# Patient Record
Sex: Female | Born: 1945 | Race: Black or African American | Hispanic: No | Marital: Single | Smoking: Never smoker
Health system: Southern US, Community
[De-identification: ages and names within clinical notes are randomized; demographics above are authoritative.]

## PROBLEM LIST (undated history)

## (undated) DIAGNOSIS — C50919 Malignant neoplasm of unspecified site of unspecified female breast: Secondary | ICD-10-CM

## (undated) DIAGNOSIS — C4490 Unspecified malignant neoplasm of skin, unspecified: Secondary | ICD-10-CM

## (undated) DIAGNOSIS — M1990 Unspecified osteoarthritis, unspecified site: Secondary | ICD-10-CM

## (undated) DIAGNOSIS — E7800 Pure hypercholesterolemia, unspecified: Secondary | ICD-10-CM

## (undated) DIAGNOSIS — F329 Major depressive disorder, single episode, unspecified: Secondary | ICD-10-CM

## (undated) DIAGNOSIS — Z9221 Personal history of antineoplastic chemotherapy: Secondary | ICD-10-CM

## (undated) DIAGNOSIS — I10 Essential (primary) hypertension: Secondary | ICD-10-CM

## (undated) DIAGNOSIS — Q8500 Neurofibromatosis, unspecified: Secondary | ICD-10-CM

## (undated) DIAGNOSIS — Z923 Personal history of irradiation: Secondary | ICD-10-CM

## (undated) DIAGNOSIS — F32A Depression, unspecified: Secondary | ICD-10-CM

## (undated) DIAGNOSIS — M810 Age-related osteoporosis without current pathological fracture: Secondary | ICD-10-CM

## (undated) DIAGNOSIS — D509 Iron deficiency anemia, unspecified: Secondary | ICD-10-CM

## (undated) DIAGNOSIS — M359 Systemic involvement of connective tissue, unspecified: Secondary | ICD-10-CM

## (undated) DIAGNOSIS — E785 Hyperlipidemia, unspecified: Secondary | ICD-10-CM

## (undated) HISTORY — DX: Major depressive disorder, single episode, unspecified: F32.9

## (undated) HISTORY — DX: Depression, unspecified: F32.A

## (undated) HISTORY — PX: ABDOMINAL HYSTERECTOMY: SHX81

## (undated) HISTORY — DX: Iron deficiency anemia, unspecified: D50.9

## (undated) HISTORY — DX: Pure hypercholesterolemia, unspecified: E78.00

## (undated) HISTORY — DX: Hyperlipidemia, unspecified: E78.5

## (undated) HISTORY — DX: Age-related osteoporosis without current pathological fracture: M81.0

## (undated) HISTORY — DX: Neurofibromatosis, unspecified: Q85.00

## (undated) HISTORY — PX: TONSILLECTOMY: SUR1361

## (undated) HISTORY — DX: Unspecified osteoarthritis, unspecified site: M19.90

## (undated) HISTORY — PX: OTHER SURGICAL HISTORY: SHX169

---

## 2004-08-08 ENCOUNTER — Ambulatory Visit: Payer: Self-pay | Admitting: Unknown Physician Specialty

## 2013-03-11 ENCOUNTER — Emergency Department: Payer: Self-pay | Admitting: Emergency Medicine

## 2013-11-04 HISTORY — PX: MASTECTOMY: SHX3

## 2014-02-10 ENCOUNTER — Ambulatory Visit: Payer: Self-pay | Admitting: Surgery

## 2014-02-18 ENCOUNTER — Ambulatory Visit: Payer: Self-pay | Admitting: Surgery

## 2014-03-15 ENCOUNTER — Ambulatory Visit: Payer: Self-pay | Admitting: Hematology and Oncology

## 2014-03-15 LAB — COMPREHENSIVE METABOLIC PANEL
ALBUMIN: 4.3 g/dL (ref 3.4–5.0)
AST: 16 U/L (ref 15–37)
Alkaline Phosphatase: 40 U/L — ABNORMAL LOW
Anion Gap: 9 (ref 7–16)
BILIRUBIN TOTAL: 0.5 mg/dL (ref 0.2–1.0)
BUN: 14 mg/dL (ref 7–18)
CO2: 28 mmol/L (ref 21–32)
CREATININE: 0.76 mg/dL (ref 0.60–1.30)
Calcium, Total: 9.3 mg/dL (ref 8.5–10.1)
Chloride: 103 mmol/L (ref 98–107)
EGFR (Non-African Amer.): >60
Glucose: 123 mg/dL — ABNORMAL HIGH (ref 65–99)
Osmolality: 281 (ref 275–301)
Potassium: 3.9 mmol/L (ref 3.5–5.1)
SGPT (ALT): 24 U/L (ref 12–78)
Sodium: 140 mmol/L (ref 136–145)
TOTAL PROTEIN: 8 g/dL (ref 6.4–8.2)

## 2014-03-15 LAB — LACTATE DEHYDROGENASE: LDH: 140 U/L (ref 81–246)

## 2014-03-15 LAB — CBC CANCER CENTER
BASOS PCT: 0.6 %
Basophil #: 0 x10 3/mm (ref 0.0–0.1)
EOS ABS: 0 x10 3/mm (ref 0.0–0.7)
EOS PCT: 0.9 %
HCT: 39.9 % (ref 35.0–47.0)
HGB: 12.8 g/dL (ref 12.0–16.0)
Lymphocyte #: 1.3 x10 3/mm (ref 1.0–3.6)
Lymphocyte %: 25.6 %
MCH: 26.8 pg (ref 26.0–34.0)
MCHC: 32.2 g/dL (ref 32.0–36.0)
MCV: 83 fL (ref 80–100)
Monocyte #: 0.5 x10 3/mm (ref 0.2–0.9)
Monocyte %: 9 %
Neutrophil #: 3.2 x10 3/mm (ref 1.4–6.5)
Neutrophil %: 63.9 %
Platelet: 174 x10 3/mm (ref 150–440)
RBC: 4.79 10*6/uL (ref 3.80–5.20)
RDW: 15.6 % — ABNORMAL HIGH (ref 11.5–14.5)
WBC: 5.1 x10 3/mm (ref 3.6–11.0)

## 2014-03-15 LAB — PATHOLOGY REPORT

## 2014-03-17 ENCOUNTER — Ambulatory Visit: Payer: Self-pay | Admitting: Hematology and Oncology

## 2014-03-24 ENCOUNTER — Ambulatory Visit: Payer: Self-pay | Admitting: Surgery

## 2014-04-04 ENCOUNTER — Ambulatory Visit: Payer: Self-pay | Admitting: Hematology and Oncology

## 2014-04-04 LAB — CBC CANCER CENTER
Basophil #: 0 x10 3/mm (ref 0.0–0.1)
Basophil %: 0.6 %
Eosinophil #: 0 x10 3/mm (ref 0.0–0.7)
Eosinophil %: 0.3 %
HCT: 39.5 % (ref 35.0–47.0)
HGB: 12.5 g/dL (ref 12.0–16.0)
LYMPHS ABS: 1 x10 3/mm (ref 1.0–3.6)
Lymphocyte %: 16.7 %
MCH: 26.6 pg (ref 26.0–34.0)
MCHC: 31.7 g/dL — ABNORMAL LOW (ref 32.0–36.0)
MCV: 84 fL (ref 80–100)
Monocyte #: 0.6 x10 3/mm (ref 0.2–0.9)
Monocyte %: 10.2 %
Neutrophil #: 4.4 x10 3/mm (ref 1.4–6.5)
Neutrophil %: 72.2 %
Platelet: 189 x10 3/mm (ref 150–440)
RBC: 4.72 10*6/uL (ref 3.80–5.20)
RDW: 15.4 % — AB (ref 11.5–14.5)
WBC: 6.2 x10 3/mm (ref 3.6–11.0)

## 2014-04-04 LAB — COMPREHENSIVE METABOLIC PANEL
ANION GAP: 5 — AB (ref 7–16)
Albumin: 3.8 g/dL (ref 3.4–5.0)
Alkaline Phosphatase: 35 U/L — ABNORMAL LOW
BUN: 14 mg/dL (ref 7–18)
Bilirubin,Total: 0.4 mg/dL (ref 0.2–1.0)
CO2: 30 mmol/L (ref 21–32)
CREATININE: 0.79 mg/dL (ref 0.60–1.30)
Calcium, Total: 9 mg/dL (ref 8.5–10.1)
Chloride: 103 mmol/L (ref 98–107)
EGFR (African American): >60
Glucose: 119 mg/dL — ABNORMAL HIGH (ref 65–99)
Osmolality: 277 (ref 275–301)
Potassium: 3.4 mmol/L — ABNORMAL LOW (ref 3.5–5.1)
SGOT(AST): 13 U/L — ABNORMAL LOW (ref 15–37)
SGPT (ALT): 24 U/L (ref 12–78)
Sodium: 138 mmol/L (ref 136–145)
Total Protein: 7.1 g/dL (ref 6.4–8.2)

## 2014-04-05 LAB — PATHOLOGY REPORT

## 2014-04-18 LAB — CBC CANCER CENTER
BASOS ABS: 0 x10 3/mm (ref 0.0–0.1)
Basophil %: 0.6 %
EOS ABS: 0.1 x10 3/mm (ref 0.0–0.7)
Eosinophil %: 1.4 %
HCT: 39.6 % (ref 35.0–47.0)
HGB: 12.7 g/dL (ref 12.0–16.0)
LYMPHS ABS: 1.1 x10 3/mm (ref 1.0–3.6)
Lymphocyte %: 28.3 %
MCH: 26.7 pg (ref 26.0–34.0)
MCHC: 32 g/dL (ref 32.0–36.0)
MCV: 83 fL (ref 80–100)
MONOS PCT: 11.1 %
Monocyte #: 0.5 x10 3/mm (ref 0.2–0.9)
NEUTROS PCT: 58.6 %
Neutrophil #: 2.4 x10 3/mm (ref 1.4–6.5)
PLATELETS: 172 x10 3/mm (ref 150–440)
RBC: 4.75 10*6/uL (ref 3.80–5.20)
RDW: 15.6 % — AB (ref 11.5–14.5)
WBC: 4.1 x10 3/mm (ref 3.6–11.0)

## 2014-04-18 LAB — COMPREHENSIVE METABOLIC PANEL
ALBUMIN: 3.9 g/dL (ref 3.4–5.0)
ALK PHOS: 37 U/L — AB
Anion Gap: 8 (ref 7–16)
BUN: 13 mg/dL (ref 7–18)
Bilirubin,Total: 0.5 mg/dL (ref 0.2–1.0)
CREATININE: 0.74 mg/dL (ref 0.60–1.30)
Calcium, Total: 9.1 mg/dL (ref 8.5–10.1)
Chloride: 104 mmol/L (ref 98–107)
Co2: 29 mmol/L (ref 21–32)
EGFR (African American): >60
EGFR (Non-African Amer.): >60
Glucose: 110 mg/dL — ABNORMAL HIGH (ref 65–99)
OSMOLALITY: 282 (ref 275–301)
Potassium: 3.8 mmol/L (ref 3.5–5.1)
SGOT(AST): 14 U/L — ABNORMAL LOW (ref 15–37)
SGPT (ALT): 23 U/L (ref 12–78)
SODIUM: 141 mmol/L (ref 136–145)
Total Protein: 7.3 g/dL (ref 6.4–8.2)

## 2014-04-25 LAB — CBC CANCER CENTER
BASOS PCT: 0.5 %
Basophil #: 0 x10 3/mm (ref 0.0–0.1)
EOS PCT: 1.6 %
Eosinophil #: 0.1 x10 3/mm (ref 0.0–0.7)
HCT: 40.1 % (ref 35.0–47.0)
HGB: 12.8 g/dL (ref 12.0–16.0)
LYMPHS PCT: 27.7 %
Lymphocyte #: 1 x10 3/mm (ref 1.0–3.6)
MCH: 26.8 pg (ref 26.0–34.0)
MCHC: 31.9 g/dL — AB (ref 32.0–36.0)
MCV: 84 fL (ref 80–100)
Monocyte #: 0.3 x10 3/mm (ref 0.2–0.9)
Monocyte %: 9.7 %
Neutrophil #: 2.1 x10 3/mm (ref 1.4–6.5)
Neutrophil %: 60.5 %
PLATELETS: 172 x10 3/mm (ref 150–440)
RBC: 4.77 10*6/uL (ref 3.80–5.20)
RDW: 15.2 % — ABNORMAL HIGH (ref 11.5–14.5)
WBC: 3.5 x10 3/mm — AB (ref 3.6–11.0)

## 2014-05-02 LAB — CBC CANCER CENTER
BASOS ABS: 0 x10 3/mm (ref 0.0–0.1)
Basophil %: 0.4 %
EOS ABS: 0.1 x10 3/mm (ref 0.0–0.7)
Eosinophil %: 1.4 %
HCT: 38 % (ref 35.0–47.0)
HGB: 12.2 g/dL (ref 12.0–16.0)
LYMPHS ABS: 1 x10 3/mm (ref 1.0–3.6)
Lymphocyte %: 19.7 %
MCH: 26.7 pg (ref 26.0–34.0)
MCHC: 32.1 g/dL (ref 32.0–36.0)
MCV: 83 fL (ref 80–100)
MONO ABS: 0.5 x10 3/mm (ref 0.2–0.9)
Monocyte %: 10.2 %
Neutrophil #: 3.4 x10 3/mm (ref 1.4–6.5)
Neutrophil %: 68.3 %
Platelet: 168 x10 3/mm (ref 150–440)
RBC: 4.57 10*6/uL (ref 3.80–5.20)
RDW: 15.5 % — ABNORMAL HIGH (ref 11.5–14.5)
WBC: 4.9 x10 3/mm (ref 3.6–11.0)

## 2014-05-04 ENCOUNTER — Ambulatory Visit: Payer: Self-pay | Admitting: Hematology and Oncology

## 2014-05-09 LAB — CBC CANCER CENTER
Basophil #: 0 x10 3/mm (ref 0.0–0.1)
Basophil %: 0.4 %
Eosinophil #: 0.1 x10 3/mm (ref 0.0–0.7)
Eosinophil %: 2.1 %
HCT: 39.5 % (ref 35.0–47.0)
HGB: 12.6 g/dL (ref 12.0–16.0)
LYMPHS ABS: 1 x10 3/mm (ref 1.0–3.6)
Lymphocyte %: 23.2 %
MCH: 26.6 pg (ref 26.0–34.0)
MCHC: 31.9 g/dL — AB (ref 32.0–36.0)
MCV: 83 fL (ref 80–100)
MONOS PCT: 12.5 %
Monocyte #: 0.5 x10 3/mm (ref 0.2–0.9)
NEUTROS ABS: 2.5 x10 3/mm (ref 1.4–6.5)
NEUTROS PCT: 61.8 %
Platelet: 175 x10 3/mm (ref 150–440)
RBC: 4.74 10*6/uL (ref 3.80–5.20)
RDW: 15.4 % — ABNORMAL HIGH (ref 11.5–14.5)
WBC: 4.1 x10 3/mm (ref 3.6–11.0)

## 2014-05-16 LAB — CBC CANCER CENTER
BASOS PCT: 0.4 %
Basophil #: 0 x10 3/mm (ref 0.0–0.1)
Eosinophil #: 0 x10 3/mm (ref 0.0–0.7)
Eosinophil %: 0.5 %
HCT: 40.6 % (ref 35.0–47.0)
HGB: 12.9 g/dL (ref 12.0–16.0)
Lymphocyte #: 0.6 x10 3/mm — ABNORMAL LOW (ref 1.0–3.6)
Lymphocyte %: 15.2 %
MCH: 26.5 pg (ref 26.0–34.0)
MCHC: 31.8 g/dL — ABNORMAL LOW (ref 32.0–36.0)
MCV: 83 fL (ref 80–100)
MONOS PCT: 8.5 %
Monocyte #: 0.4 x10 3/mm (ref 0.2–0.9)
NEUTROS PCT: 75.4 %
Neutrophil #: 3.2 x10 3/mm (ref 1.4–6.5)
Platelet: 179 x10 3/mm (ref 150–440)
RBC: 4.87 10*6/uL (ref 3.80–5.20)
RDW: 15.4 % — AB (ref 11.5–14.5)
WBC: 4.3 x10 3/mm (ref 3.6–11.0)

## 2014-05-23 LAB — CBC CANCER CENTER
Basophil #: 0 x10 3/mm (ref 0.0–0.1)
Basophil %: 0.7 %
Eosinophil #: 0.1 x10 3/mm (ref 0.0–0.7)
Eosinophil %: 2.7 %
HCT: 38.3 % (ref 35.0–47.0)
HGB: 12.2 g/dL (ref 12.0–16.0)
Lymphocyte #: 0.7 x10 3/mm — ABNORMAL LOW (ref 1.0–3.6)
Lymphocyte %: 18.2 %
MCH: 26.7 pg (ref 26.0–34.0)
MCHC: 31.9 g/dL — ABNORMAL LOW (ref 32.0–36.0)
MCV: 83 fL (ref 80–100)
Monocyte #: 0.6 x10 3/mm (ref 0.2–0.9)
Monocyte %: 14.4 %
NEUTROS ABS: 2.5 x10 3/mm (ref 1.4–6.5)
NEUTROS PCT: 64 %
PLATELETS: 153 x10 3/mm (ref 150–440)
RBC: 4.59 10*6/uL (ref 3.80–5.20)
RDW: 15.8 % — AB (ref 11.5–14.5)
WBC: 4 x10 3/mm (ref 3.6–11.0)

## 2014-05-30 LAB — CBC CANCER CENTER
BASOS ABS: 0 x10 3/mm (ref 0.0–0.1)
BASOS PCT: 0.6 %
EOS PCT: 2.4 %
Eosinophil #: 0.1 x10 3/mm (ref 0.0–0.7)
HCT: 38.3 % (ref 35.0–47.0)
HGB: 12.3 g/dL (ref 12.0–16.0)
LYMPHS ABS: 0.6 x10 3/mm — AB (ref 1.0–3.6)
Lymphocyte %: 14.6 %
MCH: 26.7 pg (ref 26.0–34.0)
MCHC: 32 g/dL (ref 32.0–36.0)
MCV: 83 fL (ref 80–100)
MONO ABS: 0.7 x10 3/mm (ref 0.2–0.9)
MONOS PCT: 17.4 %
Neutrophil #: 2.5 x10 3/mm (ref 1.4–6.5)
Neutrophil %: 65 %
Platelet: 175 x10 3/mm (ref 150–440)
RBC: 4.59 10*6/uL (ref 3.80–5.20)
RDW: 15.9 % — ABNORMAL HIGH (ref 11.5–14.5)
WBC: 3.8 x10 3/mm (ref 3.6–11.0)

## 2014-06-04 ENCOUNTER — Ambulatory Visit: Payer: Self-pay | Admitting: Hematology and Oncology

## 2014-06-06 LAB — COMPREHENSIVE METABOLIC PANEL
AST: 11 U/L — AB (ref 15–37)
Albumin: 4.1 g/dL (ref 3.4–5.0)
Alkaline Phosphatase: 41 U/L — ABNORMAL LOW
Anion Gap: 9 (ref 7–16)
BILIRUBIN TOTAL: 0.4 mg/dL (ref 0.2–1.0)
BUN: 17 mg/dL (ref 7–18)
CHLORIDE: 104 mmol/L (ref 98–107)
CO2: 28 mmol/L (ref 21–32)
CREATININE: 0.87 mg/dL (ref 0.60–1.30)
Calcium, Total: 9.2 mg/dL (ref 8.5–10.1)
EGFR (African American): >60
EGFR (Non-African Amer.): >60
Glucose: 112 mg/dL — ABNORMAL HIGH (ref 65–99)
Osmolality: 284 (ref 275–301)
Potassium: 4 mmol/L (ref 3.5–5.1)
SGPT (ALT): 18 U/L
Sodium: 141 mmol/L (ref 136–145)
Total Protein: 7.6 g/dL (ref 6.4–8.2)

## 2014-06-06 LAB — CBC CANCER CENTER
BASOS ABS: 0.1 x10 3/mm (ref 0.0–0.1)
Basophil %: 1.5 %
EOS PCT: 1.5 %
Eosinophil #: 0.1 x10 3/mm (ref 0.0–0.7)
HCT: 38.8 % (ref 35.0–47.0)
HGB: 12.4 g/dL (ref 12.0–16.0)
LYMPHS ABS: 0.8 x10 3/mm — AB (ref 1.0–3.6)
Lymphocyte %: 22.4 %
MCH: 26.8 pg (ref 26.0–34.0)
MCHC: 31.9 g/dL — ABNORMAL LOW (ref 32.0–36.0)
MCV: 84 fL (ref 80–100)
Monocyte #: 0.4 x10 3/mm (ref 0.2–0.9)
Monocyte %: 11.3 %
Neutrophil #: 2.2 x10 3/mm (ref 1.4–6.5)
Neutrophil %: 63.3 %
PLATELETS: 171 x10 3/mm (ref 150–440)
RBC: 4.63 10*6/uL (ref 3.80–5.20)
RDW: 15.9 % — ABNORMAL HIGH (ref 11.5–14.5)
WBC: 3.5 x10 3/mm — AB (ref 3.6–11.0)

## 2014-06-06 LAB — LACTATE DEHYDROGENASE: LDH: 131 U/L (ref 81–246)

## 2014-07-05 ENCOUNTER — Ambulatory Visit: Payer: Self-pay | Admitting: Hematology and Oncology

## 2014-07-08 LAB — CBC CANCER CENTER
BASOS PCT: 0.8 %
Basophil #: 0 x10 3/mm (ref 0.0–0.1)
EOS PCT: 1 %
Eosinophil #: 0 x10 3/mm (ref 0.0–0.7)
HCT: 38.5 % (ref 35.0–47.0)
HGB: 12.2 g/dL (ref 12.0–16.0)
LYMPHS PCT: 18.5 %
Lymphocyte #: 0.8 x10 3/mm — ABNORMAL LOW (ref 1.0–3.6)
MCH: 26.9 pg (ref 26.0–34.0)
MCHC: 31.7 g/dL — AB (ref 32.0–36.0)
MCV: 85 fL (ref 80–100)
MONO ABS: 0.5 x10 3/mm (ref 0.2–0.9)
Monocyte %: 11.7 %
NEUTROS PCT: 68 %
Neutrophil #: 3 x10 3/mm (ref 1.4–6.5)
Platelet: 172 x10 3/mm (ref 150–440)
RBC: 4.54 10*6/uL (ref 3.80–5.20)
RDW: 16.3 % — AB (ref 11.5–14.5)
WBC: 4.4 x10 3/mm (ref 3.6–11.0)

## 2014-07-08 LAB — COMPREHENSIVE METABOLIC PANEL
Albumin: 4 g/dL (ref 3.4–5.0)
Alkaline Phosphatase: 36 U/L — ABNORMAL LOW
Anion Gap: 7 (ref 7–16)
BUN: 11 mg/dL (ref 7–18)
Bilirubin,Total: 0.4 mg/dL (ref 0.2–1.0)
CHLORIDE: 102 mmol/L (ref 98–107)
CO2: 31 mmol/L (ref 21–32)
Calcium, Total: 9.3 mg/dL (ref 8.5–10.1)
Creatinine: 0.74 mg/dL (ref 0.60–1.30)
Glucose: 101 mg/dL — ABNORMAL HIGH (ref 65–99)
Osmolality: 279 (ref 275–301)
Potassium: 3.9 mmol/L (ref 3.5–5.1)
SGOT(AST): 15 U/L (ref 15–37)
SGPT (ALT): 23 U/L
SODIUM: 140 mmol/L (ref 136–145)
Total Protein: 7.4 g/dL (ref 6.4–8.2)

## 2014-07-22 LAB — COMPREHENSIVE METABOLIC PANEL
ALBUMIN: 3.9 g/dL (ref 3.4–5.0)
ALT: 38 U/L
ANION GAP: 5 — AB (ref 7–16)
AST: 27 U/L (ref 15–37)
Alkaline Phosphatase: 32 U/L — ABNORMAL LOW
BUN: 5 mg/dL — ABNORMAL LOW (ref 7–18)
Bilirubin,Total: 0.4 mg/dL (ref 0.2–1.0)
CO2: 32 mmol/L (ref 21–32)
Calcium, Total: 9.3 mg/dL (ref 8.5–10.1)
Chloride: 101 mmol/L (ref 98–107)
Creatinine: 0.87 mg/dL (ref 0.60–1.30)
EGFR (African American): >60
Glucose: 107 mg/dL — ABNORMAL HIGH (ref 65–99)
Osmolality: 273 (ref 275–301)
Potassium: 3.6 mmol/L (ref 3.5–5.1)
Sodium: 138 mmol/L (ref 136–145)
Total Protein: 7 g/dL (ref 6.4–8.2)

## 2014-07-22 LAB — CBC CANCER CENTER
BASOS ABS: 0 x10 3/mm (ref 0.0–0.1)
Basophil %: 0.9 %
Eosinophil #: 0 x10 3/mm (ref 0.0–0.7)
Eosinophil %: 0.2 %
HCT: 38.6 % (ref 35.0–47.0)
HGB: 12.2 g/dL (ref 12.0–16.0)
Lymphocyte #: 0.6 x10 3/mm — ABNORMAL LOW (ref 1.0–3.6)
Lymphocyte %: 30.3 %
MCH: 26.6 pg (ref 26.0–34.0)
MCHC: 31.6 g/dL — AB (ref 32.0–36.0)
MCV: 84 fL (ref 80–100)
MONO ABS: 0.4 x10 3/mm (ref 0.2–0.9)
MONOS PCT: 18.2 %
NEUTROS ABS: 1.1 x10 3/mm — AB (ref 1.4–6.5)
Neutrophil %: 50.4 %
Platelet: 148 x10 3/mm — ABNORMAL LOW (ref 150–440)
RBC: 4.57 10*6/uL (ref 3.80–5.20)
RDW: 16.7 % — AB (ref 11.5–14.5)
WBC: 2.1 x10 3/mm — AB (ref 3.6–11.0)

## 2014-08-03 DIAGNOSIS — M069 Rheumatoid arthritis, unspecified: Secondary | ICD-10-CM | POA: Insufficient documentation

## 2014-08-03 DIAGNOSIS — M810 Age-related osteoporosis without current pathological fracture: Secondary | ICD-10-CM | POA: Insufficient documentation

## 2014-08-04 ENCOUNTER — Ambulatory Visit: Payer: Self-pay | Admitting: Hematology and Oncology

## 2014-08-05 LAB — CBC CANCER CENTER
BASOS PCT: 0.8 %
Basophil #: 0 x10 3/mm (ref 0.0–0.1)
Eosinophil #: 0 x10 3/mm (ref 0.0–0.7)
Eosinophil %: 0.1 %
HCT: 37.2 % (ref 35.0–47.0)
HGB: 12 g/dL (ref 12.0–16.0)
LYMPHS PCT: 30.2 %
Lymphocyte #: 0.7 x10 3/mm — ABNORMAL LOW (ref 1.0–3.6)
MCH: 26.9 pg (ref 26.0–34.0)
MCHC: 32.2 g/dL (ref 32.0–36.0)
MCV: 84 fL (ref 80–100)
MONOS PCT: 18 %
Monocyte #: 0.4 x10 3/mm (ref 0.2–0.9)
NEUTROS ABS: 1.2 x10 3/mm — AB (ref 1.4–6.5)
NEUTROS PCT: 50.9 %
Platelet: 92 x10 3/mm — ABNORMAL LOW (ref 150–440)
RBC: 4.45 10*6/uL (ref 3.80–5.20)
RDW: 16.2 % — ABNORMAL HIGH (ref 11.5–14.5)
WBC: 2.3 x10 3/mm — AB (ref 3.6–11.0)

## 2014-08-05 LAB — COMPREHENSIVE METABOLIC PANEL
Albumin: 3.9 g/dL (ref 3.4–5.0)
Alkaline Phosphatase: 39 U/L — ABNORMAL LOW
Anion Gap: 7 (ref 7–16)
BILIRUBIN TOTAL: 0.8 mg/dL (ref 0.2–1.0)
BUN: 11 mg/dL (ref 7–18)
CHLORIDE: 99 mmol/L (ref 98–107)
CREATININE: 0.87 mg/dL (ref 0.60–1.30)
Calcium, Total: 9.4 mg/dL (ref 8.5–10.1)
Co2: 31 mmol/L (ref 21–32)
EGFR (Non-African Amer.): >60
GLUCOSE: 105 mg/dL — AB (ref 65–99)
Osmolality: 274 (ref 275–301)
Potassium: 3.6 mmol/L (ref 3.5–5.1)
SGOT(AST): 39 U/L — ABNORMAL HIGH (ref 15–37)
SGPT (ALT): 59 U/L
SODIUM: 137 mmol/L (ref 136–145)
Total Protein: 7.1 g/dL (ref 6.4–8.2)

## 2014-08-19 LAB — CBC CANCER CENTER
Basophil #: 0 x10 3/mm (ref 0.0–0.1)
Basophil %: 2.2 %
EOS ABS: 0 x10 3/mm (ref 0.0–0.7)
Eosinophil %: 0 %
HCT: 32.7 % — AB (ref 35.0–47.0)
HGB: 10.6 g/dL — ABNORMAL LOW (ref 12.0–16.0)
LYMPHS ABS: 0.5 x10 3/mm — AB (ref 1.0–3.6)
Lymphocyte %: 23.5 %
MCH: 27.3 pg (ref 26.0–34.0)
MCHC: 32.5 g/dL (ref 32.0–36.0)
MCV: 84 fL (ref 80–100)
Monocyte #: 0.3 x10 3/mm (ref 0.2–0.9)
Monocyte %: 16 %
Neutrophil #: 1.3 x10 3/mm — ABNORMAL LOW (ref 1.4–6.5)
Neutrophil %: 58.3 %
Platelet: 116 x10 3/mm — ABNORMAL LOW (ref 150–440)
RBC: 3.9 10*6/uL (ref 3.80–5.20)
RDW: 15.9 % — ABNORMAL HIGH (ref 11.5–14.5)
WBC: 2.2 x10 3/mm — AB (ref 3.6–11.0)

## 2014-08-19 LAB — COMPREHENSIVE METABOLIC PANEL
ALK PHOS: 44 U/L — AB
Albumin: 3.9 g/dL (ref 3.4–5.0)
Anion Gap: 9 (ref 7–16)
BUN: 9 mg/dL (ref 7–18)
Bilirubin,Total: 1 mg/dL (ref 0.2–1.0)
CALCIUM: 9.1 mg/dL (ref 8.5–10.1)
Chloride: 102 mmol/L (ref 98–107)
Co2: 29 mmol/L (ref 21–32)
Creatinine: 0.79 mg/dL (ref 0.60–1.30)
EGFR (African American): >60
GLUCOSE: 105 mg/dL — AB (ref 65–99)
OSMOLALITY: 278 (ref 275–301)
POTASSIUM: 3.2 mmol/L — AB (ref 3.5–5.1)
SGOT(AST): 40 U/L — ABNORMAL HIGH (ref 15–37)
SGPT (ALT): 69 U/L — ABNORMAL HIGH
Sodium: 140 mmol/L (ref 136–145)
Total Protein: 7 g/dL (ref 6.4–8.2)

## 2014-09-02 LAB — CBC CANCER CENTER
Basophil #: 0 x10 3/mm (ref 0.0–0.1)
Basophil %: 1 %
EOS ABS: 0 x10 3/mm (ref 0.0–0.7)
EOS PCT: 0 %
HCT: 27.1 % — AB (ref 35.0–47.0)
HGB: 8.9 g/dL — AB (ref 12.0–16.0)
LYMPHS PCT: 32 %
Lymphocyte #: 0.9 x10 3/mm — ABNORMAL LOW (ref 1.0–3.6)
MCH: 27.8 pg (ref 26.0–34.0)
MCHC: 32.9 g/dL (ref 32.0–36.0)
MCV: 85 fL (ref 80–100)
Monocyte #: 0.5 x10 3/mm (ref 0.2–0.9)
Monocyte %: 17.2 %
NEUTROS ABS: 1.5 x10 3/mm (ref 1.4–6.5)
Neutrophil %: 49.8 %
Platelet: 127 x10 3/mm — ABNORMAL LOW (ref 150–440)
RBC: 3.21 10*6/uL — AB (ref 3.80–5.20)
RDW: 16.6 % — ABNORMAL HIGH (ref 11.5–14.5)
WBC: 2.9 x10 3/mm — ABNORMAL LOW (ref 3.6–11.0)

## 2014-09-02 LAB — COMPREHENSIVE METABOLIC PANEL
ALK PHOS: 42 U/L — AB
ALT: 58 U/L
Albumin: 3.7 g/dL (ref 3.4–5.0)
Anion Gap: 7 (ref 7–16)
BUN: 7 mg/dL (ref 7–18)
Bilirubin,Total: 0.9 mg/dL (ref 0.2–1.0)
CHLORIDE: 106 mmol/L (ref 98–107)
CO2: 29 mmol/L (ref 21–32)
Calcium, Total: 9.2 mg/dL (ref 8.5–10.1)
Creatinine: 0.79 mg/dL (ref 0.60–1.30)
EGFR (African American): >60
EGFR (Non-African Amer.): >60
GLUCOSE: 102 mg/dL — AB (ref 65–99)
OSMOLALITY: 281 (ref 275–301)
POTASSIUM: 3.6 mmol/L (ref 3.5–5.1)
SGOT(AST): 35 U/L (ref 15–37)
Sodium: 142 mmol/L (ref 136–145)
Total Protein: 6.7 g/dL (ref 6.4–8.2)

## 2014-09-04 ENCOUNTER — Ambulatory Visit: Payer: Self-pay | Admitting: Hematology and Oncology

## 2014-09-19 ENCOUNTER — Ambulatory Visit: Payer: Self-pay | Admitting: Unknown Physician Specialty

## 2014-09-23 LAB — CBC CANCER CENTER
Basophil #: 0.1 x10 3/mm (ref 0.0–0.1)
Basophil %: 1.8 %
Eosinophil #: 0 x10 3/mm (ref 0.0–0.7)
Eosinophil %: 0.1 %
HCT: 27 % — ABNORMAL LOW (ref 35.0–47.0)
HGB: 8.6 g/dL — ABNORMAL LOW (ref 12.0–16.0)
LYMPHS PCT: 36.6 %
Lymphocyte #: 1.2 x10 3/mm (ref 1.0–3.6)
MCH: 28.5 pg (ref 26.0–34.0)
MCHC: 32.1 g/dL (ref 32.0–36.0)
MCV: 89 fL (ref 80–100)
MONO ABS: 0.5 x10 3/mm (ref 0.2–0.9)
MONOS PCT: 14.2 %
NEUTROS ABS: 1.5 x10 3/mm (ref 1.4–6.5)
NEUTROS PCT: 47.3 %
PLATELETS: 140 x10 3/mm — AB (ref 150–440)
RBC: 3.03 10*6/uL — ABNORMAL LOW (ref 3.80–5.20)
RDW: 19.8 % — ABNORMAL HIGH (ref 11.5–14.5)
WBC: 3.2 x10 3/mm — AB (ref 3.6–11.0)

## 2014-09-23 LAB — IRON AND TIBC
IRON: 77 ug/dL (ref 50–170)
Iron Bind.Cap.(Total): 263 ug/dL (ref 250–450)
Iron Saturation: 29 %
UNBOUND IRON-BIND. CAP.: 186 ug/dL

## 2014-09-23 LAB — COMPREHENSIVE METABOLIC PANEL
ALBUMIN: 3.6 g/dL (ref 3.4–5.0)
Alkaline Phosphatase: 40 U/L — ABNORMAL LOW
Anion Gap: 9 (ref 7–16)
BILIRUBIN TOTAL: 0.8 mg/dL (ref 0.2–1.0)
BUN: 9 mg/dL (ref 7–18)
CHLORIDE: 105 mmol/L (ref 98–107)
CO2: 28 mmol/L (ref 21–32)
Calcium, Total: 8.6 mg/dL (ref 8.5–10.1)
Creatinine: 0.76 mg/dL (ref 0.60–1.30)
EGFR (African American): >60
Glucose: 107 mg/dL — ABNORMAL HIGH (ref 65–99)
OSMOLALITY: 282 (ref 275–301)
Potassium: 3.4 mmol/L — ABNORMAL LOW (ref 3.5–5.1)
SGOT(AST): 33 U/L (ref 15–37)
SGPT (ALT): 47 U/L
SODIUM: 142 mmol/L (ref 136–145)
Total Protein: 6.6 g/dL (ref 6.4–8.2)

## 2014-09-23 LAB — FERRITIN: FERRITIN (ARMC): 1803 ng/mL — AB (ref 8–388)

## 2014-10-04 ENCOUNTER — Ambulatory Visit: Payer: Self-pay | Admitting: Hematology and Oncology

## 2014-10-21 LAB — COMPREHENSIVE METABOLIC PANEL
Albumin: 3.8 g/dL (ref 3.4–5.0)
Alkaline Phosphatase: 41 U/L — ABNORMAL LOW
Anion Gap: 12 (ref 7–16)
BILIRUBIN TOTAL: 0.9 mg/dL (ref 0.2–1.0)
BUN: 9 mg/dL (ref 7–18)
CALCIUM: 9.1 mg/dL (ref 8.5–10.1)
Chloride: 100 mmol/L (ref 98–107)
Co2: 27 mmol/L (ref 21–32)
Creatinine: 0.72 mg/dL (ref 0.60–1.30)
EGFR (African American): >60
GLUCOSE: 98 mg/dL (ref 65–99)
OSMOLALITY: 276 (ref 275–301)
Potassium: 3 mmol/L — ABNORMAL LOW (ref 3.5–5.1)
SGOT(AST): 27 U/L (ref 15–37)
SGPT (ALT): 36 U/L
SODIUM: 139 mmol/L (ref 136–145)
TOTAL PROTEIN: 7.4 g/dL (ref 6.4–8.2)

## 2014-10-21 LAB — CBC CANCER CENTER
BASOS ABS: 0.1 x10 3/mm (ref 0.0–0.1)
BASOS PCT: 2.9 %
EOS PCT: 0 %
Eosinophil #: 0 x10 3/mm (ref 0.0–0.7)
HCT: 30.6 % — ABNORMAL LOW (ref 35.0–47.0)
HGB: 10 g/dL — AB (ref 12.0–16.0)
LYMPHS ABS: 0.8 x10 3/mm — AB (ref 1.0–3.6)
LYMPHS PCT: 34.3 %
MCH: 29.6 pg (ref 26.0–34.0)
MCHC: 32.5 g/dL (ref 32.0–36.0)
MCV: 91 fL (ref 80–100)
MONO ABS: 0.3 x10 3/mm (ref 0.2–0.9)
MONOS PCT: 13.9 %
NEUTROS ABS: 1.2 x10 3/mm — AB (ref 1.4–6.5)
NEUTROS PCT: 48.9 %
Platelet: 163 x10 3/mm (ref 150–440)
RBC: 3.36 10*6/uL — ABNORMAL LOW (ref 3.80–5.20)
RDW: 16.7 % — ABNORMAL HIGH (ref 11.5–14.5)
WBC: 2.4 x10 3/mm — ABNORMAL LOW (ref 3.6–11.0)

## 2014-10-27 LAB — CBC CANCER CENTER
BASOS ABS: 0.1 x10 3/mm (ref 0.0–0.1)
BASOS PCT: 2.3 %
EOS ABS: 0 x10 3/mm (ref 0.0–0.7)
Eosinophil %: 0.2 %
HCT: 29.3 % — ABNORMAL LOW (ref 35.0–47.0)
HGB: 9.5 g/dL — ABNORMAL LOW (ref 12.0–16.0)
Lymphocyte #: 1 x10 3/mm (ref 1.0–3.6)
Lymphocyte %: 34.8 %
MCH: 29.3 pg (ref 26.0–34.0)
MCHC: 32.4 g/dL (ref 32.0–36.0)
MCV: 91 fL (ref 80–100)
MONOS PCT: 15 %
Monocyte #: 0.4 x10 3/mm (ref 0.2–0.9)
NEUTROS ABS: 1.4 x10 3/mm (ref 1.4–6.5)
NEUTROS PCT: 47.7 %
Platelet: 136 x10 3/mm — ABNORMAL LOW (ref 150–440)
RBC: 3.23 10*6/uL — AB (ref 3.80–5.20)
RDW: 16.2 % — ABNORMAL HIGH (ref 11.5–14.5)
WBC: 2.9 x10 3/mm — ABNORMAL LOW (ref 3.6–11.0)

## 2014-11-03 LAB — CBC CANCER CENTER
BASOS PCT: 0.8 %
Basophil #: 0 x10 3/mm (ref 0.0–0.1)
EOS ABS: 0 x10 3/mm (ref 0.0–0.7)
EOS PCT: 0.2 %
HCT: 28.6 % — ABNORMAL LOW (ref 35.0–47.0)
HGB: 9.2 g/dL — AB (ref 12.0–16.0)
Lymphocyte #: 1.2 x10 3/mm (ref 1.0–3.6)
Lymphocyte %: 33.4 %
MCH: 29.3 pg (ref 26.0–34.0)
MCHC: 32.2 g/dL (ref 32.0–36.0)
MCV: 91 fL (ref 80–100)
MONO ABS: 0.5 x10 3/mm (ref 0.2–0.9)
MONOS PCT: 13.6 %
NEUTROS ABS: 1.9 x10 3/mm (ref 1.4–6.5)
NEUTROS PCT: 52 %
Platelet: 121 x10 3/mm — ABNORMAL LOW (ref 150–440)
RBC: 3.14 10*6/uL — ABNORMAL LOW (ref 3.80–5.20)
RDW: 15.7 % — AB (ref 11.5–14.5)
WBC: 3.7 x10 3/mm (ref 3.6–11.0)

## 2014-11-04 ENCOUNTER — Ambulatory Visit: Payer: Self-pay | Admitting: Hematology and Oncology

## 2014-11-04 ENCOUNTER — Ambulatory Visit: Payer: Self-pay | Admitting: Oncology

## 2014-11-09 ENCOUNTER — Ambulatory Visit: Payer: Self-pay | Admitting: Oncology

## 2014-11-11 LAB — CBC CANCER CENTER
BASOS PCT: 3.5 %
Basophil #: 0.1 x10 3/mm (ref 0.0–0.1)
EOS ABS: 0 x10 3/mm (ref 0.0–0.7)
Eosinophil %: 0.3 %
HCT: 28.9 % — ABNORMAL LOW (ref 35.0–47.0)
HGB: 9.5 g/dL — AB (ref 12.0–16.0)
LYMPHS PCT: 26.3 %
Lymphocyte #: 1 x10 3/mm (ref 1.0–3.6)
MCH: 29.9 pg (ref 26.0–34.0)
MCHC: 32.7 g/dL (ref 32.0–36.0)
MCV: 91 fL (ref 80–100)
Monocyte #: 0.4 x10 3/mm (ref 0.2–0.9)
Monocyte %: 9.7 %
Neutrophil #: 2.3 x10 3/mm (ref 1.4–6.5)
Neutrophil %: 60.2 %
Platelet: 137 x10 3/mm — ABNORMAL LOW (ref 150–440)
RBC: 3.17 10*6/uL — ABNORMAL LOW (ref 3.80–5.20)
RDW: 15.9 % — AB (ref 11.5–14.5)
WBC: 3.8 x10 3/mm (ref 3.6–11.0)

## 2014-11-18 LAB — CBC CANCER CENTER
BASOS ABS: 0 x10 3/mm (ref 0.0–0.1)
Basophil %: 1.2 %
EOS PCT: 0 %
Eosinophil #: 0 x10 3/mm (ref 0.0–0.7)
HCT: 27.3 % — ABNORMAL LOW (ref 35.0–47.0)
HGB: 8.8 g/dL — ABNORMAL LOW (ref 12.0–16.0)
LYMPHS ABS: 1.1 x10 3/mm (ref 1.0–3.6)
Lymphocyte %: 26.4 %
MCH: 29.5 pg (ref 26.0–34.0)
MCHC: 32.2 g/dL (ref 32.0–36.0)
MCV: 92 fL (ref 80–100)
Monocyte #: 0.6 x10 3/mm (ref 0.2–0.9)
Monocyte %: 15.1 %
Neutrophil #: 2.3 x10 3/mm (ref 1.4–6.5)
Neutrophil %: 57.3 %
PLATELETS: 127 x10 3/mm — AB (ref 150–440)
RBC: 2.97 10*6/uL — ABNORMAL LOW (ref 3.80–5.20)
RDW: 15.7 % — AB (ref 11.5–14.5)
WBC: 4 x10 3/mm (ref 3.6–11.0)

## 2014-11-18 LAB — COMPREHENSIVE METABOLIC PANEL
ALBUMIN: 3.8 g/dL (ref 3.4–5.0)
ANION GAP: 10 (ref 7–16)
Alkaline Phosphatase: 42 U/L — ABNORMAL LOW
BUN: 10 mg/dL (ref 7–18)
Bilirubin,Total: 0.9 mg/dL (ref 0.2–1.0)
CHLORIDE: 105 mmol/L (ref 98–107)
CREATININE: 0.74 mg/dL (ref 0.60–1.30)
Calcium, Total: 9 mg/dL (ref 8.5–10.1)
Co2: 28 mmol/L (ref 21–32)
EGFR (Non-African Amer.): >60
Glucose: 108 mg/dL — ABNORMAL HIGH (ref 65–99)
OSMOLALITY: 285 (ref 275–301)
Potassium: 3.2 mmol/L — ABNORMAL LOW (ref 3.5–5.1)
SGOT(AST): 30 U/L (ref 15–37)
SGPT (ALT): 38 U/L
SODIUM: 143 mmol/L (ref 136–145)
TOTAL PROTEIN: 7.2 g/dL (ref 6.4–8.2)

## 2014-12-01 LAB — CBC CANCER CENTER
BASOS ABS: 0.1 x10 3/mm (ref 0.0–0.1)
Basophil %: 3.7 %
EOS ABS: 0 x10 3/mm (ref 0.0–0.7)
Eosinophil %: 0.5 %
HCT: 26.7 % — ABNORMAL LOW (ref 35.0–47.0)
HGB: 8.5 g/dL — ABNORMAL LOW (ref 12.0–16.0)
LYMPHS PCT: 23.8 %
Lymphocyte #: 0.9 x10 3/mm — ABNORMAL LOW (ref 1.0–3.6)
MCH: 29.3 pg (ref 26.0–34.0)
MCHC: 31.9 g/dL — AB (ref 32.0–36.0)
MCV: 92 fL (ref 80–100)
MONOS PCT: 10.4 %
Monocyte #: 0.4 x10 3/mm (ref 0.2–0.9)
NEUTROS PCT: 61.6 %
Neutrophil #: 2.4 x10 3/mm (ref 1.4–6.5)
Platelet: 141 x10 3/mm — ABNORMAL LOW (ref 150–440)
RBC: 2.91 10*6/uL — ABNORMAL LOW (ref 3.80–5.20)
RDW: 16 % — AB (ref 11.5–14.5)
WBC: 3.9 x10 3/mm (ref 3.6–11.0)

## 2014-12-05 ENCOUNTER — Ambulatory Visit: Payer: Self-pay | Admitting: Oncology

## 2014-12-05 ENCOUNTER — Ambulatory Visit: Payer: Self-pay | Admitting: Hematology and Oncology

## 2015-01-03 ENCOUNTER — Ambulatory Visit
Admit: 2015-01-03 | Disposition: A | Discharge status: Home / Self Care | Payer: Self-pay | Attending: Oncology | Admitting: Oncology

## 2015-01-31 IMAGING — CT NM PET TUM IMG RESTAG (PS) SKULL BASE T - THIGH
1 of 9 series · 1 of 25 positions shown · non-contrast
Comparison: 03/17/2014

CLINICAL DATA: Initial treatment strategy for melanoma. Right
breast..

EXAM:
NUCLEAR MEDICINE PET SKULL BASE TO THIGH
TECHNIQUE: 12.2 mCi F-18 FDG was injected intravenously. Full-ring PET imaging
was performed from the skull base to thigh after the radiotracer. CT
data was obtained and used for attenuation correction and anatomic
localization.
FASTING BLOOD GLUCOSE:  Value: 84 mg/dl

[Series 4: pet wb (ac) · axial · 5.0mm · 4.07mm/px · 1 of 564 slices shown]
[im 376/564]
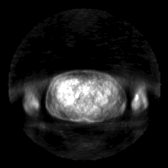

[1 of 25 positions shown; findings below may reference images not displayed]

FINDINGS: NECK

No hypermetabolic lymph nodes in the neck.

CHEST

Post- operative change from a right mastectomy identified. Increased
uptake within the right chest wall and right chest wall musculature
is identified. This is favored to represent post treatment changes.
No hypermetabolic lymph nodes identified within the supraclavicular
or axillary regions. Scarring and architectural distortion within
the right middle lobe is noted. Tiny nodule within the right upper
lobe measures 4 mm and is too small to characterize by PET-CT. This
is unchanged from the previous exam. Subsegmental atelectasis versus
scar noted within the left posterior lung base. No hypermetabolic
pulmonary nodules are mass noted. There is no hypermetabolic
mediastinal or hilar lymph nodes.

ABDOMEN/PELVIS

No abnormal hypermetabolic activity within the liver, pancreas,
adrenal glands, or spleen. No hypermetabolic lymph nodes in the
abdomen or pelvis.

SKELETON

No focal hypermetabolic activity to suggest skeletal metastasis.
IMPRESSION: 1. No specific features identified to suggest residual tumor or
metastatic disease.
2. Postoperative changes involving the right chest wall consistent
with prior mastectomy and external beam radiation.
3. Small nonspecific pulmonary nodule is unchanged and remains too
small to reliably characterize.

## 2015-02-02 LAB — COMPREHENSIVE METABOLIC PANEL
ALBUMIN: 4.3 g/dL
ALK PHOS: 40 U/L
ANION GAP: 5 — AB (ref 7–16)
BUN: 9 mg/dL
Bilirubin,Total: 1 mg/dL
CALCIUM: 9.2 mg/dL
CHLORIDE: 106 mmol/L
CREATININE: 0.5 mg/dL
Co2: 28 mmol/L
EGFR (African American): >60
EGFR (Non-African Amer.): >60
GLUCOSE: 113 mg/dL — AB
POTASSIUM: 3.7 mmol/L
SGOT(AST): 27 U/L
SGPT (ALT): 22 U/L
Sodium: 139 mmol/L
TOTAL PROTEIN: 7.4 g/dL

## 2015-02-02 LAB — CBC CANCER CENTER
BASOS ABS: 0 x10 3/mm (ref 0.0–0.1)
Basophil %: 0.9 %
Eosinophil #: 0 x10 3/mm (ref 0.0–0.7)
Eosinophil %: 0.2 %
HCT: 26.4 % — ABNORMAL LOW (ref 35.0–47.0)
HGB: 8.6 g/dL — ABNORMAL LOW (ref 12.0–16.0)
LYMPHS PCT: 34.6 %
Lymphocyte #: 1.1 x10 3/mm (ref 1.0–3.6)
MCH: 31 pg (ref 26.0–34.0)
MCHC: 32.7 g/dL (ref 32.0–36.0)
MCV: 95 fL (ref 80–100)
MONOS PCT: 13.3 %
Monocyte #: 0.4 x10 3/mm (ref 0.2–0.9)
Neutrophil #: 1.7 x10 3/mm (ref 1.4–6.5)
Neutrophil %: 51 %
PLATELETS: 141 x10 3/mm — AB (ref 150–440)
RBC: 2.79 10*6/uL — AB (ref 3.80–5.20)
RDW: 17.4 % — ABNORMAL HIGH (ref 11.5–14.5)
WBC: 3.3 x10 3/mm — AB (ref 3.6–11.0)

## 2015-02-03 ENCOUNTER — Ambulatory Visit
Admit: 2015-02-03 | Disposition: A | Discharge status: Home / Self Care | Payer: Self-pay | Attending: Oncology | Admitting: Oncology

## 2015-02-25 NOTE — Consult Note (Signed)
Reason for Visit: This 69 year old Female patient presents to the clinic for initial evaluation of  melanoma of the breast .   Referred by Dr. Kallie Edward.  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  Pathology Report pathology report reviewed   Imaging Report PET CT scan and CT scans reviewed   Referral Report clinical notes reviewed   Planned Treatment Regimen adjuvant chest wall radiation   HPI   patient is a 69 year old female with a history of neurofibromatosis whose history dates back several years with a raised lesion in the inferior portion of the right breast. She had a biopsy in 2013 which was benign and showed neurofibroma. Earlier this year lesion increased in size and began to ulcerate seen by surgeon underwent biopsy which was positive for invasive melanoma Clark's level V with 3.5 cm of invasion. Deep and lateral margins were positive. Tumor was of desmoplastic type. 12 mitoses per high power field. This was a stage TIV B. lesion. She went on to have a right modified radical mastectomy. No residual melanoma was seen 17 lymph nodes were free of metastatic disease. Patient has a history of rheumatoid arthritis is on methotrexate and Enbrel for that. She also has a history of hypertension and again neurofibromatosis. She is seen today for consideration of adjuvant treatment to her chest wall. She is healing well.  Past Hx:    Depression:    Iron deficiency anemia:    Neurofibromatosis:    Hyperlipidemia:    Osteoporosis:    Hypercholesterolemia:    HTN:    Arthritis:    Excision right breast mass:    Hysterectomy - Partial:   Past, Family and Social History:  Past Medical History positive   Cardiovascular hyperlipidemia; hypertension   Neurological/Psychiatric  depression   Past Surgical History hysterectomy   Past Medical History Comments iron deficiency anemia, osteoarthritis, osteoporosis   Family History noncontributory   Social History noncontributory   Additional Past Medical and Surgical History seen by herself ttoday   Allergies:   Crestor: Muscles aches  Sulfa drugs: Unknown  Home Meds:  Home Medications: Medication Instructions Status  hydrochlorothiazide-valsartan 12.5 mg-80 mg oral tablet 1 tab(s) orally once a day Active  folic acid 1 mg oral tablet 1 tab(s) orally once a day Active  alendronate weekly 70 milligram(s) orally once a week Active  Vitamin D3 1000 intl units oral tablet 1 tab(s) orally 2 times a day Active  methotrexate 2.5 mg oral tablet  orally once a week  Active  Multi-Day with Calcium and Extra Iron Therapeutic Multiple Vitamins with Minerals oral tablet 1 tab(s) orally once a day Active  aspirin 81 mg oral tablet 1 tab(s) orally once a day Active  Iron 100 Plus Vitamin B Complex with C, Folic Acid and Iron oral tablet 1 tab(s) orally once a day Active  Caltrate 600 + D 600 mg-800 intl units oral tablet 1 tab(s) orally once a day Active  Vytorin 10 mg-20 mg oral tablet 1 tab(s) orally once a day (in the morning) Active  Enbrel 50 mg/mL subcutaneous solution 1 milliliter(s) subcutaneous once a day Active  ferrous sulfate 325 mg oral delayed release tablet 1 tab(s) orally once a day Active  Diovan HCT 12.5 mg-80 mg oral tablet 1 tab(s) orally once a day Active   Review of Systems:  General negative   Performance Status (ECOG) 0   Skin see HPI   Breast see HPI   Ophthalmologic negative   ENMT negative   Respiratory and Thorax negative   Cardiovascular negative   Gastrointestinal negative   Genitourinary negative   Musculoskeletal negative   Neurological negative   Psychiatric negative   Hematology/Lymphatics negative   Endocrine negative   Allergic/Immunologic negative   Review  of Systems   review of systems obtained from nurses notes  Nursing Notes:  Nursing Vital Signs and Chemo Nursing Nursing Notes: *CC Vital Signs Flowsheet:   04-Jun-15 09:00  Temp Temperature 96  Pulse Pulse 81  Respirations Respirations 20  SBP SBP 149  DBP DBP 87  Pain Scale (0-10)  0  Current Weight (kg) (kg) 79.6  Height (cm) centimeters 163.5  BSA (m2) 1.8   Physical Exam:  General/Skin/HEENT:  General normal   Skin normal   Eyes normal   ENMT normal   Head and Neck normal   Additional PE well-developed female in NAD. Lungs are clear to A&P cardiac examination shows regular rate and rhythm. She status post right modified radical mastectomy. Skin is healing well. Left breast is free of dominant mass or nodularity in 2 positions examined. No axillary or supraclavicular adenopathy is appreciated. Abdomen is benign.   Breasts/Resp/CV/GI/GU:  Respiratory and Thorax normal   Cardiovascular normal   Gastrointestinal normal   Genitourinary normal   MS/Neuro/Psych/Lymph:  Musculoskeletal normal   Neurological normal   Lymphatics normal   Other Results:  Radiology Results: LabUnknown:    14-May-15 15:24, PET/CT Scan Melanoma Initial Staging  PACS Image     19-May-15 14:53, MRI Brain  With/Without Contrast  PACS Image   MRI:  MRI Brain  With/Without Contrast   REASON FOR EXAM:    newly diagosed melanoma staging work up  COMMENTS:       PROCEDURE: MR  - MR BRAIN WO/W CONTRAST  - Mar 22 2014  2:53PM     CLINICAL DATA:  Newly diagnosed melanoma.  Staging workup.    EXAM:  MRI HEAD WITHOUT AND WITH CONTRAST    TECHNIQUE:  Multiplanar, multiecho pulse sequences of the brain and surrounding  structures were obtained without and with intravenous contrast.    CONTRAST:  MultiHance 16 mL.  COMPARISON:  None.    FINDINGS:  No evidence for acute infarction, hemorrhage, mass lesion,  hydrocephalus, or extra-axial fluid. Mild cerebral and cerebellar  atrophy  consistent with patient's age. Mild to moderate subcortical  and periventricular T2 and FLAIR hyperintensities, likely chronic  microvascular ischemic change.    There is a tiny focus of susceptibility artifact (low T2 signal) on  the gradient sequence image 18 series 9, involving the left  posterior frontal parasagittal subcortical white matter. While this  finding is more concerning in a patient with melanoma due to  possible overlap of melanotic tumor versus chronic hemorrhage, given  the lack of associated intracranial enhancement, I feel the finding  is most likely related to chronic hypertensive cerebrovascular  disease. Prominent perivascularspaces and moderate small vessel  disease are both consistent with that hypothesis. Flow voids are  maintained throughout the carotid, basilar, and vertebral arteries.    Post infusion, no abnormal enhancement of the brain or meninges.  Pituitary, pineal, and cerebellar tonsils unremarkable. No upper  cervical lesions.    Negative orbits, mastoids, and calvarium. Moderate left  frontoethmoid sinus disease with mucosal thickening and fluid  accumulation, likely chronic. Visualized extracranial  soft tissues  unremarkable.     IMPRESSION:  Mild atrophy with mild to moderate small vessel disease. No acute  intracranial findings.    Tiny focus of chronic hemorrhage suspected in the left anterior  frontal parasagittal subcortical white matter felt most likely to  represent chronic hemorrhage. Attention to this area on follow-up.    Left frontoethmoid sinus disease.  Correlate clinically for acuity.      Electronically Signed    By: Rolla Flatten M.D.    On: 03/22/2014 15:13       Verified By: Staci Righter, M.D.,  Nuclear Med:    [phone] 15:24, PET/CT Scan Melanoma Initial Staging  PET/CT Scan Melanoma Initial Staging   REASON FOR EXAM:    newly diagnosed melanoma initial staging   work up  COMMENTS:       PROCEDURE: PET - PET/CT  MELANOMA INITIAL STAGE WB  - Mar 17 2014  3:24PM     CLINICAL DATA:  Initial treatment strategy for invasive melanoma of  the right breast.    EXAM:  NUCLEAR MEDICINE PET VERTEX TO ANKLES    TECHNIQUE:  12.5 mCi F-18 FDG was injected intravenously. Full-ring PET imaging  was performed from the cranial vertex to the ankle joints after the  radiotracer. CT data was obtained and used for attenuation  correction and anatomic localization.    FASTING BLOOD GLUCOSE:  Value: 101 mg/dl    COMPARISON:  None.    FINDINGS:  NECK    Brown fat activity noted along the posterior cervical triangle and  supraclavicular regions. Symmetric glottic activity likely from  phonation during imaging. There is complete opacification of the  left frontal, sinus in several left anterior ethmoid air cells.    Mild thyroid goiter.  CHEST    No hypermetabolic mediastinal or hilar nodes. No suspicious  pulmonary nodules on the CT data. Skin thickening along the right  inferior breast likely related to prior biopsy. Small right axillary  lymph nodes do not appear particularly hypermetabolic.    ABDOMEN/PELVIS    No abnormal hypermetabolic activity within the liver, pancreas,  adrenal glands, or spleen. No hypermetabolic lymph nodes in the  abdomen or pelvis. Uterus absent. Possible lipoma with marginal  dystrophic calcification lateral to the left gluteus musculature.    SKELETON  Right proximal femoral partially calcified intramedullary lesion  with very subtly increased metabolic activity, favoring enchondroma.  Maximum SUV is 1.4. Grade 1 anterolisthesis at L4-5.     IMPRESSION:  1. No findings of residual malignancy or metastatic disease.  2. 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      Electronically Signed    By: Sherryl Barters M.D.    On: 03/17/2014 15:47         Verified By: Carron Curie, M.D.,   Relevent Results:   Relevant Scans and Labs PET scan and MRI of brain are reviewed   Assessment and Plan: Impression:   stage TIV B. N0 M0 malignant melanoma of the right breast status post modified radical mastectomy with poor prognostic features including Clark's level V, desmoplastic type and initial involvement of deep and lateral margins. Plan:   at this time based on her poor prognostic features including desmoplastic type, Clark's level V, initial  deep and lateral margin positive believe she is at high risk for local regional failure after mastectomy.desmoplastic type no melanoma is extremely rare subtyping of melanoma which has a very high local recurrence failure rate. Would not advocate treating her peripheral lymphatics would concentrate on her chest wall and deliver 5000 cGy over 5 weeks boosting her scar another thousand centigrays using electrons. Risks and benefits of treatment including skin reaction, fatigue, inclusion of some superficial lung, or explained in detail to the patient. She seems to comprehend my treatment plan well. I have set her up for CT simulation next week. I have discussed the case personally with medical oncology. Patient also will will be a candidate for systemic treatment under medical oncology's care.  I would like to take this opportunity for allowing me to participate in the care of your patient..  CC Referral:  cc: Dr. Kem Kays   Electronic Signatures: Baruch Gouty, Roda Shutters (MD)  (Signed 04-Jun-15 12:57)  Authored: HPI, Diagnosis, Past Hx, PFSH, Allergies, Home Meds, ROS, Nursing Notes, Physical Exam, Other Results, Relevent Results, Encounter Assessment and Plan, CC Referring Physician   Last Updated: 04-Jun-15 12:57 by Armstead Peaks (MD)

## 2015-02-25 NOTE — Op Note (Signed)
PATIENT NAME:  Kristina Pittman, Kristina Pittman MR#:  [id number] DATE OF BIRTH:  27-Sep-1946  DATE OF PROCEDURE:  03/24/2014  PREOPERATIVE DIAGNOSIS: Melanoma of the right breast.   POSTOPERATIVE DIAGNOSIS: Melanoma of the right breast.   PROCEDURE:  Right modified radical mastectomy.   SURGEON: Rochel Brome, MD    ANESTHESIA: General.   INDICATIONS: This 69 year old female recently had a large draining mass excised from the lower inner quadrant of the right breast which pathology demonstrates malignant melanoma which was 35 mm in dimension. Deep and lateral margins were involved and additional surgery was recommended for definitive treatment. We had anticipated a sentinel lymph node biopsy; however, during the course of surgery found that there were dark-colored firm lymph nodes found in the axilla and decision was made to proceed with the axillary dissection.   DESCRIPTION OF PROCEDURE: The patient was placed on the operating table in the supine position under general anesthesia. The right arm was placed on a lateral arm support. The right breast, axilla, and chest wall were prepared with ChloraPrep and draped in a sterile manner.   I noted the old scar in the lower inner quadrant oriented transversely. The incision was made from inferomedial to superolateral so that the lateral end was in the inferior aspect of the axilla. The incision went above and below the areola, above and below the old scar. Silk sutures were used to retract the skin edges. Skin and subcutaneous flaps were raised using electrocautery for hemostasis. These flaps were raised superiorly in the direction of the clavicle, medially towards the sternum, inferiorly to the inframammary fold, and laterally to the latissimus dorsi muscle. The breast was elevated off the underlying fascia with electrocautery proceeding from medial to lateral. The dissection was carried out up into the axillary tail and the gamma counter was used to demonstrate location of  sentinel lymph nodes and I did find that the sentinel lymph node appeared to be dark in color and was firm and approximately 8 mm. There were several other palpable lymph nodes which were dark in color and these appear to be highly suspicious for metastasis and decision was made at this point to proceed with an axillary dissection. The dissection was carried out with Metzenbaum scissors and dissected up to the axillary vein. The axillary contents were removed avoiding injury to the long thoracic nerve, thoracodorsal nerve, and axillary vein. Multiple small vessels were suture-ligated with 4-0 chromic. Multiple small bleeding points were cauterized, and the breast and axillary contents were removed and submitted for routine pathology. The wound was inspected. Several small bleeding points were cauterized. Hemostasis was subsequently intact. A 19-French Blake drain was inserted and brought out through a separate inferior stab wound and secured to the skin with 3-0 nylon suture. The drain was cut to fit so that the tip was in the axilla. Next, seeing   hemostasis was intact, the wound was closed with running 4-0 Monocryl subcuticular suture and Dermabond. The drain was activated and had scant serosanguineous drainage. Gauze dressings were applied around the drain with benzoin and 2-inch silk tape. The patient tolerated surgery satisfactorily and was then prepared for a transfer to the recovery room.   ____________________________ Lenna Sciara. Rochel Brome, MD jws:dd D: 03/24/2014 14:23:21 ET T: 03/24/2014 18:13:36 ET JOB#: [id number]  cc: Loreli Dollar, MD, <Dictator> Loreli Dollar MD ELECTRONICALLY SIGNED 03/28/2014 9:12

## 2015-02-25 NOTE — Op Note (Signed)
PATIENT NAME:  Kristina Pittman, Kristina Pittman MR#:  [id number] DATE OF BIRTH:  1946-09-28  DATE OF PROCEDURE:  02/18/2014  PREOPERATIVE DIAGNOSIS: Neurofibroma of the right breast.   POSTOPERATIVE DIAGNOSIS: Neurofibroma of the right breast.   PROCEDURE: Excision of right breast mass.   SURGEON: Rochel Brome, MD  ANESTHESIA: General.   INDICATIONS: This 69 year old female has a history of an enlarging mass of the lower inner aspect of the right breast. It does date back some 15 years ago. Biopsy in 2013 demonstrated neurofibroma. However, this has gotten much larger in recent months and also having some drainage and discomfort, and excision was recommended for definitive treatment.   DESCRIPTION OF PROCEDURE: The patient was placed on the operating table in the supine position under general anesthesia. The mass of the lower inner quadrant appeared to be approximately 4 cm in dimension and had a fungating appearance. It was also at the lower inner border of the areola. A transversely oriented excision was carried out which was slightly oblique with the medial end at the 3 o'clock position of the breast and the lateral end was below the nipple. The dissection was carried down through subcutaneous tissues. The skin itself was somewhat thickened, likely due to chronic inflammation. The dissection was carried down into soft, fatty, more normal-appearing fatty tissue which was excised with electrocautery. The medial end was tagged with a silk stitch for the pathologist's orientation, and the specimen was submitted fresh for routine pathology. The wound was inspected. Several additional small bleeding points were cauterized. Hemostasis was subsequently intact. The subcutaneous tissues were infiltrated with 0.5% Sensorcaine with epinephrine. Next, the subcuticular layer was closed with interrupted inverted 4-0 Monocryl sutures. Next, the skin was further closed with a running 4-0 Monocryl subcuticular suture and Dermabond.  The patient tolerated the procedure satisfactorily and was then prepared for transfer to the recovery room. ____________________________ Lenna Sciara. Rochel Brome, MD jws:sb D: 02/18/2014 08:45:20 ET T: 02/18/2014 10:40:08 ET JOB#: [id number]  cc: Loreli Dollar, MD, <Dictator> Loreli Dollar MD ELECTRONICALLY SIGNED 02/22/2014 16:06

## 2015-03-02 LAB — COMPREHENSIVE METABOLIC PANEL
Albumin: 4.2 g/dL
Alkaline Phosphatase: 41 U/L
Anion Gap: 7 (ref 7–16)
BILIRUBIN TOTAL: 0.8 mg/dL
BUN: 10 mg/dL
CALCIUM: 9.1 mg/dL
CREATININE: 0.57 mg/dL
Chloride: 106 mmol/L
Co2: 28 mmol/L
EGFR (African American): >60
EGFR (Non-African Amer.): >60
Glucose: 97 mg/dL
Potassium: 3.9 mmol/L
SGOT(AST): 24 U/L
SGPT (ALT): 21 U/L
Sodium: 141 mmol/L
TOTAL PROTEIN: 7.4 g/dL

## 2015-03-02 LAB — CBC CANCER CENTER
Basophil #: 0 x10 3/mm (ref 0.0–0.1)
Basophil %: 0.9 %
EOS ABS: 0 x10 3/mm (ref 0.0–0.7)
Eosinophil %: 0.4 %
HCT: 25.5 % — ABNORMAL LOW (ref 35.0–47.0)
HGB: 8.4 g/dL — AB (ref 12.0–16.0)
LYMPHS ABS: 1.3 x10 3/mm (ref 1.0–3.6)
Lymphocyte %: 28.2 %
MCH: 30.6 pg (ref 26.0–34.0)
MCHC: 32.9 g/dL (ref 32.0–36.0)
MCV: 93 fL (ref 80–100)
MONOS PCT: 11.5 %
Monocyte #: 0.5 x10 3/mm (ref 0.2–0.9)
NEUTROS ABS: 2.7 x10 3/mm (ref 1.4–6.5)
Neutrophil %: 59 %
Platelet: 155 x10 3/mm (ref 150–440)
RBC: 2.74 10*6/uL — AB (ref 3.80–5.20)
RDW: 16.7 % — ABNORMAL HIGH (ref 11.5–14.5)
WBC: 4.6 x10 3/mm (ref 3.6–11.0)

## 2015-03-02 LAB — IRON AND TIBC
IRON BIND. CAP.(TOTAL): 288 (ref 250–450)
Iron Saturation: 24.3
Iron: 70 ug/dL
Unbound Iron-Bind.Cap.: 218.4

## 2015-03-02 LAB — SEDIMENTATION RATE: ERYTHROCYTE SED RATE: 99 mm/h — AB (ref 0–30)

## 2015-03-02 LAB — FERRITIN: Ferritin (ARMC): 934 ng/mL — ABNORMAL HIGH

## 2015-03-09 ENCOUNTER — Other Ambulatory Visit: Payer: Self-pay | Admitting: Oncology

## 2015-03-09 ENCOUNTER — Inpatient Hospital Stay: Payer: Medicare Other | Attending: Oncology

## 2015-03-09 ENCOUNTER — Encounter: Payer: Self-pay | Admitting: *Deleted

## 2015-03-09 VITALS — BP 121/55 | HR 69 | Temp 95.9°F | Resp 20

## 2015-03-09 DIAGNOSIS — Z5111 Encounter for antineoplastic chemotherapy: Secondary | ICD-10-CM | POA: Diagnosis present

## 2015-03-09 DIAGNOSIS — C4352 Malignant melanoma of skin of breast: Secondary | ICD-10-CM | POA: Insufficient documentation

## 2015-03-09 DIAGNOSIS — T451X5S Adverse effect of antineoplastic and immunosuppressive drugs, sequela: Secondary | ICD-10-CM | POA: Insufficient documentation

## 2015-03-09 DIAGNOSIS — C439 Malignant melanoma of skin, unspecified: Secondary | ICD-10-CM

## 2015-03-09 DIAGNOSIS — Z79899 Other long term (current) drug therapy: Secondary | ICD-10-CM | POA: Insufficient documentation

## 2015-03-09 DIAGNOSIS — D6481 Anemia due to antineoplastic chemotherapy: Secondary | ICD-10-CM | POA: Diagnosis not present

## 2015-03-09 DIAGNOSIS — Z7982 Long term (current) use of aspirin: Secondary | ICD-10-CM | POA: Diagnosis not present

## 2015-03-09 MED ORDER — PEGINTERFERON ALFA-2B 300 MCG ~~LOC~~ KIT
3.0000 ug/kg | PACK | Freq: Once | SUBCUTANEOUS | Status: AC
  Administered 2015-03-09: 198 ug via SUBCUTANEOUS
  Filled 2015-03-09: qty 0.5

## 2015-03-09 MED ORDER — PEGINTERFERON ALFA-2B 300 MCG ~~LOC~~ KIT
3.0000 ug/kg | PACK | Freq: Once | SUBCUTANEOUS | Status: AC
  Filled 2015-03-09: qty 0.5

## 2015-03-13 ENCOUNTER — Other Ambulatory Visit: Payer: Self-pay | Admitting: Oncology

## 2015-03-16 ENCOUNTER — Inpatient Hospital Stay: Payer: Medicare Other

## 2015-03-16 ENCOUNTER — Other Ambulatory Visit: Payer: Self-pay | Admitting: Oncology

## 2015-03-16 VITALS — BP 95/55 | HR 86 | Temp 97.1°F | Resp 20

## 2015-03-16 DIAGNOSIS — C439 Malignant melanoma of skin, unspecified: Secondary | ICD-10-CM

## 2015-03-16 DIAGNOSIS — Z5111 Encounter for antineoplastic chemotherapy: Secondary | ICD-10-CM | POA: Diagnosis not present

## 2015-03-16 MED ORDER — PEGINTERFERON ALFA-2B 300 MCG ~~LOC~~ KIT
3.0000 ug/kg | PACK | Freq: Once | SUBCUTANEOUS | Status: AC
  Administered 2015-03-16: 198 ug via SUBCUTANEOUS
  Filled 2015-03-16: qty 0.5

## 2015-03-16 MED ORDER — PEGINTERFERON ALFA-2B 300 MCG ~~LOC~~ KIT
3.0000 ug/kg | PACK | Freq: Once | SUBCUTANEOUS | Status: AC
  Filled 2015-03-16: qty 0.5

## 2015-03-23 ENCOUNTER — Inpatient Hospital Stay: Payer: Medicare Other

## 2015-03-23 VITALS — BP 86/58 | HR 71 | Temp 97.0°F | Resp 20

## 2015-03-23 DIAGNOSIS — C439 Malignant melanoma of skin, unspecified: Secondary | ICD-10-CM

## 2015-03-23 DIAGNOSIS — Z5111 Encounter for antineoplastic chemotherapy: Secondary | ICD-10-CM | POA: Diagnosis not present

## 2015-03-23 MED ORDER — PEGINTERFERON ALFA-2B 300 MCG ~~LOC~~ KIT
3.0000 ug/kg | PACK | Freq: Once | SUBCUTANEOUS | Status: AC
  Administered 2015-03-23: 198 ug via SUBCUTANEOUS
  Filled 2015-03-23: qty 0.5

## 2015-03-24 ENCOUNTER — Other Ambulatory Visit: Payer: Self-pay | Admitting: *Deleted

## 2015-03-24 DIAGNOSIS — C439 Malignant melanoma of skin, unspecified: Secondary | ICD-10-CM

## 2015-03-30 ENCOUNTER — Inpatient Hospital Stay: Payer: Medicare Other

## 2015-03-30 ENCOUNTER — Other Ambulatory Visit: Payer: Self-pay | Admitting: Oncology

## 2015-03-30 ENCOUNTER — Encounter: Payer: Self-pay | Admitting: Oncology

## 2015-03-30 ENCOUNTER — Inpatient Hospital Stay (HOSPITAL_BASED_OUTPATIENT_CLINIC_OR_DEPARTMENT_OTHER): Payer: Medicare Other | Admitting: Oncology

## 2015-03-30 VITALS — BP 138/78 | HR 96 | Temp 95.2°F | Wt 140.9 lb

## 2015-03-30 DIAGNOSIS — Z79899 Other long term (current) drug therapy: Secondary | ICD-10-CM

## 2015-03-30 DIAGNOSIS — C439 Malignant melanoma of skin, unspecified: Secondary | ICD-10-CM

## 2015-03-30 DIAGNOSIS — C4352 Malignant melanoma of skin of breast: Secondary | ICD-10-CM

## 2015-03-30 DIAGNOSIS — I10 Essential (primary) hypertension: Secondary | ICD-10-CM | POA: Insufficient documentation

## 2015-03-30 DIAGNOSIS — D6481 Anemia due to antineoplastic chemotherapy: Secondary | ICD-10-CM

## 2015-03-30 DIAGNOSIS — Q8500 Neurofibromatosis, unspecified: Secondary | ICD-10-CM | POA: Insufficient documentation

## 2015-03-30 DIAGNOSIS — Z5111 Encounter for antineoplastic chemotherapy: Secondary | ICD-10-CM | POA: Diagnosis not present

## 2015-03-30 DIAGNOSIS — F329 Major depressive disorder, single episode, unspecified: Secondary | ICD-10-CM | POA: Insufficient documentation

## 2015-03-30 DIAGNOSIS — Z7982 Long term (current) use of aspirin: Secondary | ICD-10-CM

## 2015-03-30 DIAGNOSIS — D509 Iron deficiency anemia, unspecified: Secondary | ICD-10-CM | POA: Insufficient documentation

## 2015-03-30 DIAGNOSIS — E785 Hyperlipidemia, unspecified: Secondary | ICD-10-CM | POA: Insufficient documentation

## 2015-03-30 DIAGNOSIS — F32A Depression, unspecified: Secondary | ICD-10-CM | POA: Insufficient documentation

## 2015-03-30 LAB — COMPREHENSIVE METABOLIC PANEL
ALK PHOS: 42 U/L (ref 38–126)
ALT: 17 U/L (ref 14–54)
AST: 24 U/L (ref 15–41)
Albumin: 4 g/dL (ref 3.5–5.0)
Anion gap: 4 — ABNORMAL LOW (ref 5–15)
BILIRUBIN TOTAL: 0.7 mg/dL (ref 0.3–1.2)
BUN: 10 mg/dL (ref 6–20)
CALCIUM: 9 mg/dL (ref 8.9–10.3)
CHLORIDE: 106 mmol/L (ref 101–111)
CO2: 29 mmol/L (ref 22–32)
Creatinine, Ser: 0.55 mg/dL (ref 0.44–1.00)
GFR calc non Af Amer: >60 mL/min (ref >60)
GLUCOSE: 99 mg/dL (ref 65–99)
Potassium: 3.4 mmol/L — ABNORMAL LOW (ref 3.5–5.1)
SODIUM: 139 mmol/L (ref 135–145)
TOTAL PROTEIN: 7.2 g/dL (ref 6.5–8.1)

## 2015-03-30 LAB — CBC WITH DIFFERENTIAL/PLATELET
Basophils Absolute: 0 10*3/uL (ref 0–0.1)
Basophils Relative: 1 %
Eosinophils Absolute: 0 10*3/uL (ref 0–0.7)
Eosinophils Relative: 0 %
HCT: 25.7 % — ABNORMAL LOW (ref 35.0–47.0)
Hemoglobin: 8.2 g/dL — ABNORMAL LOW (ref 12.0–16.0)
LYMPHS ABS: 1.2 10*3/uL (ref 1.0–3.6)
LYMPHS PCT: 26 %
MCH: 29.5 pg (ref 26.0–34.0)
MCHC: 32.1 g/dL (ref 32.0–36.0)
MCV: 92 fL (ref 80.0–100.0)
Monocytes Absolute: 0.6 10*3/uL (ref 0.2–0.9)
Monocytes Relative: 12 %
NEUTROS PCT: 61 %
Neutro Abs: 2.8 10*3/uL (ref 1.4–6.5)
Platelets: 159 10*3/uL (ref 150–440)
RBC: 2.79 MIL/uL — ABNORMAL LOW (ref 3.80–5.20)
RDW: 16.3 % — ABNORMAL HIGH (ref 11.5–14.5)
WBC: 4.6 10*3/uL (ref 3.6–11.0)

## 2015-03-30 MED ORDER — PEGINTERFERON ALFA-2B 300 MCG ~~LOC~~ KIT
3.0000 ug/kg | PACK | Freq: Once | SUBCUTANEOUS | Status: AC
  Administered 2015-03-30: 192 ug via SUBCUTANEOUS
  Filled 2015-03-30: qty 0.5

## 2015-03-30 NOTE — Progress Notes (Signed)
Patient never smoked.  Does not have living will.

## 2015-03-31 ENCOUNTER — Encounter: Payer: Self-pay | Admitting: Oncology

## 2015-03-31 NOTE — Progress Notes (Signed)
[place] @ [facility] [phone]  Fax:(336) [place]: [birth year]  MR#: [id number]  WNI#:[id number]  Patient Care Team: No Pcp Per Patient as PCP - General (General Practice)  CHIEF COMPLAINT:  Chief Complaint  Patient presents with  . Follow-up     No history exists.    Oncology Flowsheet 03/09/2015 03/16/2015 03/23/2015 03/30/2015  peginterferon alfa-2b (SYLATRON)  3 mcg/kg 3 mcg/kg 3 mcg/kg 3 mcg/kg    INTERVAL HISTORY:  69 year old lady came today further follow-up regarding continuation of sylatron .  No chills.  No fever.  No nausea.  No vomiting.  No diarrhea.  REVIEW OF SYSTEMS:   GENERAL:  Feels good.  Active.  No fevers, sweats or weight loss. PERFORMANCE STATUS (ECOG):  1 HEENT:  No visual changes, runny nose, sore throat, mouth sores or tenderness. Lungs: No shortness of breath or cough.  No hemoptysis. Cardiac:  No chest pain, palpitations, orthopnea, or PND. GI:  No nausea, vomiting, diarrhea, constipation, melena or hematochezia. GU:  No urgency, frequency, dysuria, or hematuria. Musculoskeletal:  No back pain.  No joint pain.  No muscle tenderness. Extremities:  No pain or swelling. Skin:  No rashes or skin changes. Neuro:  No headache, numbness or weakness, balance or coordination issues. Endocrine:  No diabetes, thyroid issues, hot flashes or night sweats. Psych:  No mood changes, depression or anxiety. Pain:  No focal pain. Review of systems:  All other systems reviewed and found to be negative.t  As per HPI. Otherwise, a complete review of systems is negatve.    PAST SURGICAL HISTORY: Preventive Screening:  Has patient had any of the following test? Colonscopy  Mammography  Pap Smear (1)   Last Colonoscopy: due fall of 2015(1)   Last Mammography: 11/2014(1)   Last Pap Smear: status post hysterectomy 1988(1)   Smoking History: Smoking History Never Smoked.(1)  PFSH: Family History: noncontributory  Social  History: negative alcohol, negative tobacco    FAMILY HISTORY No family history on file.  GYNECOLOGIC HISTORY:  No LMP recorded.     ADVANCED DIRECTIVES:    HEALTH MAINTENANCE: History  Substance Use Topics  . Smoking status: Never Smoker   . Smokeless tobacco: Not on file  . Alcohol Use: Not on file     Colonoscopy:  PAP:  Bone density:  Lipid panel:  Allergies  Allergen Reactions  . Rosuvastatin Nausea And Vomiting  . Sulfa Antibiotics     Other reaction(s): Unknown    Current Outpatient Prescriptions  Medication Sig Dispense Refill  . Acetaminophen 500 MG coapsule Take by mouth.    Marland Kitchen alendronate (FOSAMAX) 70 MG tablet TAKE 1 TABLET BY MOUTH EVERY 7 DAYS. TAKE WITH A FULL GLASS OF WATER AND DO NOT LIE DOWN FOR THE NEXT 30 MINUTES.    Marland Kitchen aspirin EC 81 MG tablet Take by mouth.    . Calcium-Vitamin D 600-200 MG-UNIT per tablet Take by mouth.    . Cholecalciferol 1000 UNITS tablet Take by mouth.    . citalopram (CELEXA) 10 MG tablet TAKE ONE (1) TABLET BY MOUTH ONCE DAILY 30 tablet 5  . citalopram (CELEXA) 10 MG tablet Take by mouth.    . ezetimibe-simvastatin (VYTORIN) 10-20 MG per tablet Take by mouth.    . ferrous sulfate 325 (65 FE) MG EC tablet Take by mouth.    . folic acid (FOLVITE) 1 MG tablet Take by mouth.    . interferon alfa-2b (INTRON-A) [id number] UNITS injection Inject into  the skin.    . methotrexate (RHEUMATREX) 2.5 MG tablet Take by mouth.    . mirtazapine (REMERON) 15 MG tablet Take by mouth.    . Multiple Vitamin (MULTI-VITAMINS) TABS Take by mouth.    . valsartan-hydrochlorothiazide (DIOVAN-HCT) 80-12.5 MG per tablet Take by mouth.     No current facility-administered medications for this visit.   Facility-Administered Medications Ordered in Other Visits  Medication Dose Route Frequency Provider Last Rate Last Dose  . peginterferon alfa-2b (SYLATRON) injection 198 mcg  3 mcg/kg Subcutaneous Once Forest Gleason, MD      . peginterferon alfa-2b  (SYLATRON) injection 198 mcg  3 mcg/kg Subcutaneous Once Forest Gleason, MD        OBJECTIVE:  Filed Vitals:   03/30/15 1201  BP: 138/78  Pulse: 96  Temp: 95.2 F (35.1 C)     Body mass index is 23.96 kg/(m^2).    ECOG FS:1 - Symptomatic but completely ambulatory  PHYSICAL EXAM: GENERAL:  Well developed, well nourished, sitting comfortably in the exam room in no acute distress. MENTAL STATUS:  Alert and oriented to person, place and time. HEAD:  .  Normocephalic, atraumatic, face symmetric, no Cushingoid features. EYES:  .  Pupils equal round and reactive to light and accomodation.  No conjunctivitis or scleral icterus. ENT:  Oropharynx clear without lesion.  Tongue normal. Mucous membranes moist.  RESPIRATORY:  Clear to auscultation without rales, wheezes or rhonchi. CARDIOVASCULAR:  Regular rate and rhythm without murmur, rub or gallop. BREAST:  Right breast : Mastectomy.  Chest wall there is no evidence of recurrent disease.  Left breast free of masses.  ABDOMEN:  Soft, non-tender, with active bowel sounds, and no hepatosplenomegaly.  No masses. BACK:  No CVA tenderness.  No tenderness on percussion of the back or rib cage. SKIN:  No rashes, ulcers or lesions. EXTREMITIES: No edema, no skin discoloration or tenderness.  No palpable cords. LYMPH NODES: No palpable cervical, supraclavicular, axillary or inguinal adenopathy  NEUROLOGICAL: Unremarkable. PSYCH:  Appropriate.   LAB RESULTS:  Appointment on 03/30/2015  Component Date Value Ref Range Status  . WBC 03/30/2015 4.6  3.6 - 11.0 K/uL Final  . RBC 03/30/2015 2.79* 3.80 - 5.20 MIL/uL Final  . Hemoglobin 03/30/2015 8.2* 12.0 - 16.0 g/dL Final  . HCT 03/30/2015 25.7* 35.0 - 47.0 % Final  . MCV 03/30/2015 92.0  80.0 - 100.0 fL Final  . MCH 03/30/2015 29.5  26.0 - 34.0 pg Final  . MCHC 03/30/2015 32.1  32.0 - 36.0 g/dL Final  . RDW 03/30/2015 16.3* 11.5 - 14.5 % Final  . Platelets 03/30/2015 159  150 - 440 K/uL Final  .  Neutrophils Relative % 03/30/2015 61   Final  . Neutro Abs 03/30/2015 2.8  1.4 - 6.5 K/uL Final  . Lymphocytes Relative 03/30/2015 26   Final  . Lymphs Abs 03/30/2015 1.2  1.0 - 3.6 K/uL Final  . Monocytes Relative 03/30/2015 12   Final  . Monocytes Absolute 03/30/2015 0.6  0.2 - 0.9 K/uL Final  . Eosinophils Relative 03/30/2015 0   Final  . Eosinophils Absolute 03/30/2015 0.0  0 - 0.7 K/uL Final  . Basophils Relative 03/30/2015 1   Final  . Basophils Absolute 03/30/2015 0.0  0 - 0.1 K/uL Final  . Sodium 03/30/2015 139  135 - 145 mmol/L Final  . Potassium 03/30/2015 3.4* 3.5 - 5.1 mmol/L Final  . Chloride 03/30/2015 106  101 - 111 mmol/L Final  . CO2 03/30/2015 29  22 - 32 mmol/L Final  . Glucose, Bld 03/30/2015 99  65 - 99 mg/dL Final  . BUN 03/30/2015 10  6 - 20 mg/dL Final  . Creatinine, Ser 03/30/2015 0.55  0.44 - 1.00 mg/dL Final  . Calcium 03/30/2015 9.0  8.9 - 10.3 mg/dL Final  . Total Protein 03/30/2015 7.2  6.5 - 8.1 g/dL Final  . Albumin 03/30/2015 4.0  3.5 - 5.0 g/dL Final  . AST 03/30/2015 24  15 - 41 U/L Final  . ALT 03/30/2015 17  14 - 54 U/L Final  . Alkaline Phosphatase 03/30/2015 42  38 - 126 U/L Final  . Total Bilirubin 03/30/2015 0.7  0.3 - 1.2 mg/dL Final  . GFR calc non Af Amer 03/30/2015 >60  >60 mL/min Final  . GFR calc Af Amer 03/30/2015 >60  >60 mL/min Final   Comment: (NOTE) The eGFR has been calculated using the CKD EPI equation. This calculation has not been validated in all clinical situations. eGFR's persistently <60 mL/min signify possible Chronic Kidney Disease.   . 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  Patient is tolerating treatment very well. Persistent anemia multifactorial Consider  GI workup   Patient expressed understanding and was in agreement with this plan. She also  understands that She can call clinic at any time with any questions, concerns, or complaints.    No matching staging information was found for the patient.  Forest Gleason, MD   03/31/2015 12:51 PM

## 2015-04-05 ENCOUNTER — Telehealth: Payer: Self-pay | Admitting: Oncology

## 2015-04-05 NOTE — Telephone Encounter (Signed)
Kristina Pittman was wondering why she is scheduled for lab visit with every injection. Dr. Oliva Bustard usually has her do sylatron weekly but does labs every three weeks. Can she continue with labs every three weeks instead?

## 2015-04-06 ENCOUNTER — Other Ambulatory Visit: Payer: Medicare Other

## 2015-04-06 ENCOUNTER — Inpatient Hospital Stay: Payer: Medicare Other | Attending: Oncology

## 2015-04-06 ENCOUNTER — Inpatient Hospital Stay: Payer: Medicare Other

## 2015-04-06 ENCOUNTER — Ambulatory Visit: Payer: Medicare Other

## 2015-04-06 VITALS — BP 99/53 | HR 74 | Temp 96.5°F | Resp 18

## 2015-04-06 DIAGNOSIS — Z5111 Encounter for antineoplastic chemotherapy: Secondary | ICD-10-CM | POA: Insufficient documentation

## 2015-04-06 DIAGNOSIS — Z79899 Other long term (current) drug therapy: Secondary | ICD-10-CM | POA: Diagnosis not present

## 2015-04-06 DIAGNOSIS — Z418 Encounter for other procedures for purposes other than remedying health state: Secondary | ICD-10-CM | POA: Diagnosis not present

## 2015-04-06 DIAGNOSIS — Q8500 Neurofibromatosis, unspecified: Secondary | ICD-10-CM | POA: Insufficient documentation

## 2015-04-06 DIAGNOSIS — M069 Rheumatoid arthritis, unspecified: Secondary | ICD-10-CM | POA: Insufficient documentation

## 2015-04-06 DIAGNOSIS — D649 Anemia, unspecified: Secondary | ICD-10-CM | POA: Insufficient documentation

## 2015-04-06 DIAGNOSIS — C439 Malignant melanoma of skin, unspecified: Secondary | ICD-10-CM

## 2015-04-06 DIAGNOSIS — I10 Essential (primary) hypertension: Secondary | ICD-10-CM | POA: Insufficient documentation

## 2015-04-06 DIAGNOSIS — C4352 Malignant melanoma of skin of breast: Secondary | ICD-10-CM | POA: Diagnosis not present

## 2015-04-06 LAB — CBC WITH DIFFERENTIAL/PLATELET
Basophils Absolute: 0.1 10*3/uL (ref 0–0.1)
Basophils Relative: 2 %
Eosinophils Absolute: 0 10*3/uL (ref 0–0.7)
Eosinophils Relative: 0 %
HCT: 24.8 % — ABNORMAL LOW (ref 35.0–47.0)
HEMOGLOBIN: 8.1 g/dL — AB (ref 12.0–16.0)
Lymphocytes Relative: 34 %
Lymphs Abs: 1.3 10*3/uL (ref 1.0–3.6)
MCH: 29.8 pg (ref 26.0–34.0)
MCHC: 32.7 g/dL (ref 32.0–36.0)
MCV: 91.1 fL (ref 80.0–100.0)
Monocytes Absolute: 0.4 10*3/uL (ref 0.2–0.9)
Monocytes Relative: 11 %
NEUTROS PCT: 53 %
Neutro Abs: 2 10*3/uL (ref 1.4–6.5)
Platelets: 153 10*3/uL (ref 150–440)
RBC: 2.72 MIL/uL — ABNORMAL LOW (ref 3.80–5.20)
RDW: 16.3 % — ABNORMAL HIGH (ref 11.5–14.5)
WBC: 3.8 10*3/uL (ref 3.6–11.0)

## 2015-04-06 LAB — COMPREHENSIVE METABOLIC PANEL
ALT: 20 U/L (ref 14–54)
ANION GAP: 8 (ref 5–15)
AST: 23 U/L (ref 15–41)
Albumin: 4.1 g/dL (ref 3.5–5.0)
Alkaline Phosphatase: 44 U/L (ref 38–126)
BUN: 12 mg/dL (ref 6–20)
CO2: 28 mmol/L (ref 22–32)
Calcium: 9.6 mg/dL (ref 8.9–10.3)
Chloride: 102 mmol/L (ref 101–111)
Creatinine, Ser: 0.56 mg/dL (ref 0.44–1.00)
GFR calc Af Amer: >60 mL/min (ref >60)
GFR calc non Af Amer: >60 mL/min (ref >60)
Glucose, Bld: 95 mg/dL (ref 65–99)
Potassium: 4.2 mmol/L (ref 3.5–5.1)
Sodium: 138 mmol/L (ref 135–145)
TOTAL PROTEIN: 7.4 g/dL (ref 6.5–8.1)
Total Bilirubin: 0.6 mg/dL (ref 0.3–1.2)

## 2015-04-06 MED ORDER — PEGINTERFERON ALFA-2B 300 MCG ~~LOC~~ KIT
3.0000 ug/kg | PACK | Freq: Once | SUBCUTANEOUS | Status: AC
  Administered 2015-04-06: 192 ug via SUBCUTANEOUS
  Filled 2015-04-06: qty 0.5

## 2015-04-06 NOTE — Telephone Encounter (Signed)
Pt informed that cbc is required prior to each injection per Dr. Oliva Bustard.

## 2015-04-13 ENCOUNTER — Inpatient Hospital Stay: Payer: Medicare Other

## 2015-04-13 ENCOUNTER — Ambulatory Visit: Payer: Medicare Other

## 2015-04-13 ENCOUNTER — Other Ambulatory Visit: Payer: Medicare Other

## 2015-04-13 VITALS — BP 84/58 | HR 82 | Temp 97.8°F | Resp 18

## 2015-04-13 DIAGNOSIS — C439 Malignant melanoma of skin, unspecified: Secondary | ICD-10-CM

## 2015-04-13 DIAGNOSIS — Z5111 Encounter for antineoplastic chemotherapy: Secondary | ICD-10-CM | POA: Diagnosis not present

## 2015-04-13 LAB — CBC WITH DIFFERENTIAL/PLATELET
BASOS ABS: 0.1 10*3/uL (ref 0–0.1)
Basophils Relative: 2 %
EOS ABS: 0 10*3/uL (ref 0–0.7)
EOS PCT: 1 %
HCT: 25.5 % — ABNORMAL LOW (ref 35.0–47.0)
HEMOGLOBIN: 8.3 g/dL — AB (ref 12.0–16.0)
LYMPHS ABS: 1.2 10*3/uL (ref 1.0–3.6)
Lymphocytes Relative: 26 %
MCH: 29.6 pg (ref 26.0–34.0)
MCHC: 32.5 g/dL (ref 32.0–36.0)
MCV: 90.9 fL (ref 80.0–100.0)
MONOS PCT: 11 %
Monocytes Absolute: 0.5 10*3/uL (ref 0.2–0.9)
NEUTROS PCT: 60 %
Neutro Abs: 2.8 10*3/uL (ref 1.4–6.5)
PLATELETS: 182 10*3/uL (ref 150–440)
RBC: 2.81 MIL/uL — ABNORMAL LOW (ref 3.80–5.20)
RDW: 16.9 % — ABNORMAL HIGH (ref 11.5–14.5)
WBC: 4.6 10*3/uL (ref 3.6–11.0)

## 2015-04-13 MED ORDER — PEGINTERFERON ALFA-2B 300 MCG ~~LOC~~ KIT
3.0000 ug/kg | PACK | Freq: Once | SUBCUTANEOUS | Status: AC
  Administered 2015-04-13: 192 ug via SUBCUTANEOUS
  Filled 2015-04-13: qty 0.5

## 2015-04-19 ENCOUNTER — Other Ambulatory Visit: Payer: Self-pay | Admitting: Oncology

## 2015-04-19 DIAGNOSIS — C439 Malignant melanoma of skin, unspecified: Secondary | ICD-10-CM

## 2015-04-19 MED ORDER — PEGINTERFERON ALFA-2B 300 MCG ~~LOC~~ KIT: 3.0000 ug/kg | PACK | Freq: Once | SUBCUTANEOUS | Status: DC

## 2015-04-20 ENCOUNTER — Inpatient Hospital Stay: Payer: Medicare Other

## 2015-04-20 ENCOUNTER — Ambulatory Visit: Payer: Medicare Other

## 2015-04-20 ENCOUNTER — Other Ambulatory Visit: Payer: Medicare Other

## 2015-04-20 VITALS — BP 96/63 | HR 80 | Temp 96.9°F

## 2015-04-20 DIAGNOSIS — C439 Malignant melanoma of skin, unspecified: Secondary | ICD-10-CM

## 2015-04-20 DIAGNOSIS — Z5111 Encounter for antineoplastic chemotherapy: Secondary | ICD-10-CM | POA: Diagnosis not present

## 2015-04-20 LAB — CBC WITH DIFFERENTIAL/PLATELET
BASOS PCT: 1 %
Basophils Absolute: 0 10*3/uL (ref 0–0.1)
EOS PCT: 0 %
Eosinophils Absolute: 0 10*3/uL (ref 0–0.7)
HEMATOCRIT: 24.6 % — AB (ref 35.0–47.0)
Hemoglobin: 7.8 g/dL — ABNORMAL LOW (ref 12.0–16.0)
LYMPHS ABS: 1.1 10*3/uL (ref 1.0–3.6)
Lymphocytes Relative: 21 %
MCH: 28.9 pg (ref 26.0–34.0)
MCHC: 31.9 g/dL — ABNORMAL LOW (ref 32.0–36.0)
MCV: 90.8 fL (ref 80.0–100.0)
Monocytes Absolute: 0.6 10*3/uL (ref 0.2–0.9)
Monocytes Relative: 11 %
Neutro Abs: 3.4 10*3/uL (ref 1.4–6.5)
Neutrophils Relative %: 67 %
PLATELETS: 179 10*3/uL (ref 150–440)
RBC: 2.71 MIL/uL — AB (ref 3.80–5.20)
RDW: 17.5 % — AB (ref 11.5–14.5)
WBC: 5.1 10*3/uL (ref 3.6–11.0)

## 2015-04-20 MED ORDER — PEGINTERFERON ALFA-2B 300 MCG ~~LOC~~ KIT
192.0000 ug | PACK | Freq: Once | SUBCUTANEOUS | Status: AC
  Administered 2015-04-20: 192 ug via SUBCUTANEOUS
  Filled 2015-04-20: qty 0.5

## 2015-04-26 ENCOUNTER — Other Ambulatory Visit: Payer: Self-pay | Admitting: *Deleted

## 2015-04-26 DIAGNOSIS — C439 Malignant melanoma of skin, unspecified: Secondary | ICD-10-CM

## 2015-04-27 ENCOUNTER — Inpatient Hospital Stay: Payer: Medicare Other

## 2015-04-27 ENCOUNTER — Inpatient Hospital Stay (HOSPITAL_BASED_OUTPATIENT_CLINIC_OR_DEPARTMENT_OTHER): Payer: Medicare Other | Admitting: Oncology

## 2015-04-27 ENCOUNTER — Other Ambulatory Visit: Payer: Self-pay | Admitting: Oncology

## 2015-04-27 VITALS — BP 91/55 | HR 71 | Temp 96.6°F

## 2015-04-27 VITALS — BP 99/69 | HR 83 | Temp 96.1°F | Wt 136.7 lb

## 2015-04-27 DIAGNOSIS — C439 Malignant melanoma of skin, unspecified: Secondary | ICD-10-CM

## 2015-04-27 DIAGNOSIS — Z8500 Personal history of malignant neoplasm of unspecified digestive organ: Secondary | ICD-10-CM

## 2015-04-27 DIAGNOSIS — Z79899 Other long term (current) drug therapy: Secondary | ICD-10-CM

## 2015-04-27 DIAGNOSIS — M069 Rheumatoid arthritis, unspecified: Secondary | ICD-10-CM

## 2015-04-27 DIAGNOSIS — I10 Essential (primary) hypertension: Secondary | ICD-10-CM

## 2015-04-27 DIAGNOSIS — C4352 Malignant melanoma of skin of breast: Secondary | ICD-10-CM | POA: Diagnosis not present

## 2015-04-27 DIAGNOSIS — D649 Anemia, unspecified: Secondary | ICD-10-CM

## 2015-04-27 DIAGNOSIS — Z5111 Encounter for antineoplastic chemotherapy: Secondary | ICD-10-CM | POA: Diagnosis not present

## 2015-04-27 LAB — CBC WITH DIFFERENTIAL/PLATELET
BASOS ABS: 0.1 10*3/uL (ref 0–0.1)
Basophils Relative: 1 %
Eosinophils Absolute: 0 10*3/uL (ref 0–0.7)
Eosinophils Relative: 1 %
HEMATOCRIT: 24.9 % — AB (ref 35.0–47.0)
Hemoglobin: 8 g/dL — ABNORMAL LOW (ref 12.0–16.0)
LYMPHS PCT: 28 %
Lymphs Abs: 1.2 10*3/uL (ref 1.0–3.6)
MCH: 29.3 pg (ref 26.0–34.0)
MCHC: 32 g/dL (ref 32.0–36.0)
MCV: 91.5 fL (ref 80.0–100.0)
MONO ABS: 0.5 10*3/uL (ref 0.2–0.9)
Monocytes Relative: 12 %
Neutro Abs: 2.5 10*3/uL (ref 1.4–6.5)
Neutrophils Relative %: 58 %
Platelets: 156 10*3/uL (ref 150–440)
RBC: 2.72 MIL/uL — AB (ref 3.80–5.20)
RDW: 17.9 % — AB (ref 11.5–14.5)
WBC: 4.4 10*3/uL (ref 3.6–11.0)

## 2015-04-27 LAB — COMPREHENSIVE METABOLIC PANEL
ALK PHOS: 41 U/L (ref 38–126)
ALT: 21 U/L (ref 14–54)
AST: 25 U/L (ref 15–41)
Albumin: 4 g/dL (ref 3.5–5.0)
Anion gap: 5 (ref 5–15)
BILIRUBIN TOTAL: 0.8 mg/dL (ref 0.3–1.2)
BUN: 11 mg/dL (ref 6–20)
CHLORIDE: 104 mmol/L (ref 101–111)
CO2: 28 mmol/L (ref 22–32)
CREATININE: 0.59 mg/dL (ref 0.44–1.00)
Calcium: 8.8 mg/dL — ABNORMAL LOW (ref 8.9–10.3)
GFR calc Af Amer: >60 mL/min (ref >60)
GFR calc non Af Amer: >60 mL/min (ref >60)
Glucose, Bld: 100 mg/dL — ABNORMAL HIGH (ref 65–99)
Potassium: 3.5 mmol/L (ref 3.5–5.1)
Sodium: 137 mmol/L (ref 135–145)
Total Protein: 7.4 g/dL (ref 6.5–8.1)

## 2015-04-27 MED ORDER — PEGINTERFERON ALFA-2B 300 MCG ~~LOC~~ KIT
192.0000 ug | PACK | Freq: Once | SUBCUTANEOUS | Status: DC
  Filled 2015-04-27: qty 0.5

## 2015-04-27 MED ORDER — PEGINTERFERON ALFA-2B 300 MCG ~~LOC~~ KIT
192.0000 ug | PACK | Freq: Once | SUBCUTANEOUS | Status: AC
  Administered 2015-04-27: 300 ug via SUBCUTANEOUS
  Filled 2015-04-27: qty 0.5

## 2015-04-27 NOTE — Progress Notes (Signed)
Patient does not have living will.  Never smoked.  C/o being tired all the time.  No energy.

## 2015-04-29 ENCOUNTER — Encounter: Payer: Self-pay | Admitting: Oncology

## 2015-04-29 MED ORDER — PEGINTERFERON ALFA-2B 300 MCG ~~LOC~~ KIT: 3.0000 ug/kg | PACK | Freq: Once | SUBCUTANEOUS | Status: DC

## 2015-04-29 NOTE — Progress Notes (Signed)
[place] @ [facility] [phone]  Fax:(336) [place]: 1946/09/07  MR#: [id number]  SWF#:[id number]  Patient Care Team: Glendon Axe, MD as PCP - General (Internal Medicine)  CHIEF COMPLAINT:  Chief Complaint  Patient presents with  . Follow-up    Oncology History   Chief Complaint/Diagnosis:   69 year old female with newly diagnosed invasive melanoma of right breast. 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 Earlier this year this lesion started increasing in size and ulcerating. Patient had a mammogram done at [facility] facility October 2014 reported as benign. Seen by Dr. 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 BRAF [id number] negative patient medical history significant for rheumatoid arthritis on methotrexate and Enbrel also significant for hypertension and neurofibromatosis.     [name]    Oncology Flowsheet 03/16/2015 03/23/2015 03/30/2015 04/06/2015 04/13/2015 04/20/2015 04/27/2015  peginterferon alfa-2b (SYLATRON) [place] 3 mcg/kg 3 mcg/kg 3 mcg/kg 3 mcg/kg 3 mcg/kg 192 mcg 300 mcg    INTERVAL HISTORY:  69 year old lady came today further follow-up regarding continuation of sylatron .  No chills.  No fever.  No nausea.  No vomiting.  No diarrhea. Patient is here for continuation of interferon therapy.  Continues to feel weak tired.  Anemic patient did have colonoscopy done  REVIEW OF SYSTEMS:   GENERAL:  Feels good.  Active.  No fevers, sweats or weight loss. PERFORMANCE STATUS (ECOG):  1 HEENT:  No visual changes, runny nose, sore throat, mouth sores or tenderness. Lungs: No shortness of breath or cough.  No hemoptysis. Cardiac:  No chest pain, palpitations, orthopnea, or  PND. GI:  No nausea, vomiting, diarrhea, constipation, melena or hematochezia. GU:  No urgency, frequency, dysuria, or hematuria. Musculoskeletal:  No back pain.  No joint pain.  No muscle tenderness. Extremities:  No pain or swelling. Skin:  No rashes or skin changes. Neuro:  No headache, numbness or weakness, balance or coordination issues. Endocrine:  No diabetes, thyroid issues, hot flashes or night sweats. Psych:  No mood changes, depression or anxiety. Pain:  No focal pain. Review of systems:  All other systems reviewed and found to be negative.t  As per HPI. Otherwise, a complete review of systems is negatve.    PAST SURGICAL HISTORY: Preventive Screening:  Has patient had any of the following test? Colonscopy  Mammography  Pap Smear (1)   Last Colonoscopy: due fall of 2015(1)   Last Mammography: 11/2014(1)   Last Pap Smear: status post hysterectomy 1988(1)   Smoking History: Smoking History Never Smoked.(1)  PFSH: Family History: noncontributory  Social History: negative alcohol, negative tobacco    FAMILY HISTORY No family history on file.  GYNECOLOGIC HISTORY:  No LMP recorded.     ADVANCED DIRECTIVES:    HEALTH MAINTENANCE: History  Substance Use Topics  . Smoking status: Never Smoker   . Smokeless tobacco: Not on file  . Alcohol Use: Not on file     Colonoscopy:  PAP:  Bone density:  Lipid panel:  Allergies  Allergen Reactions  . Rosuvastatin Nausea And Vomiting  . Sulfa Antibiotics     Other reaction(s): Unknown    Current Outpatient Prescriptions  Medication Sig Dispense Refill  . Acetaminophen 500 MG coapsule Take by mouth.    Marland Kitchen  alendronate (FOSAMAX) 70 MG tablet TAKE 1 TABLET BY MOUTH EVERY 7 DAYS. TAKE WITH A FULL GLASS OF WATER AND DO NOT LIE DOWN FOR THE NEXT 30 MINUTES.    Marland Kitchen aspirin EC 81 MG tablet Take by mouth.    . Calcium-Vitamin D 600-200 MG-UNIT per tablet Take by mouth.    . Cholecalciferol 1000 UNITS tablet Take by  mouth.    . citalopram (CELEXA) 10 MG tablet TAKE ONE (1) TABLET BY MOUTH ONCE DAILY 30 tablet 5  . ezetimibe-simvastatin (VYTORIN) 10-20 MG per tablet Take by mouth.    . ferrous sulfate 325 (65 FE) MG EC tablet Take by mouth.    . folic acid (FOLVITE) 1 MG tablet Take by mouth.    . interferon alfa-2b (INTRON-A) [id number] UNITS injection Inject into the skin.    . methotrexate (RHEUMATREX) 2.5 MG tablet Take by mouth.    . mirtazapine (REMERON) 15 MG tablet Take by mouth.    . Multiple Vitamin (MULTI-VITAMINS) TABS Take by mouth.    . valsartan-hydrochlorothiazide (DIOVAN-HCT) 80-12.5 MG per tablet Take by mouth.    . citalopram (CELEXA) 10 MG tablet Take by mouth.     No current facility-administered medications for this visit.   Facility-Administered Medications Ordered in Other Visits  Medication Dose Route Frequency Provider Last Rate Last Dose  . peginterferon alfa-2b (SYLATRON) injection 198 mcg  3 mcg/kg Subcutaneous Once Forest Gleason, MD      . peginterferon alfa-2b (SYLATRON) injection 198 mcg  3 mcg/kg Subcutaneous Once Forest Gleason, MD        OBJECTIVE:  Filed Vitals:   04/27/15 1012  BP: 99/69  Pulse: 83  Temp: 96.1 F (35.6 C)     Body mass index is 23.25 kg/(m^2).    ECOG FS:1 - Symptomatic but completely ambulatory  PHYSICAL EXAM: GENERAL:  Well developed, well nourished, sitting comfortably in the exam room in no acute distress. MENTAL STATUS:  Alert and oriented to person, place and time. HEAD:  .  Normocephalic, atraumatic, face symmetric, no Cushingoid features. EYES:  .  Pupils equal round and reactive to light and accomodation.  No conjunctivitis or scleral icterus. ENT:  Oropharynx clear without lesion.  Tongue normal. Mucous membranes moist.  RESPIRATORY:  Clear to auscultation without rales, wheezes or rhonchi. CARDIOVASCULAR:  Regular rate and rhythm without murmur, rub or gallop. BREAST:  Right breast : Mastectomy.  Chest wall there is no evidence of  recurrent disease.  Left breast free of masses.  ABDOMEN:  Soft, non-tender, with active bowel sounds, and no hepatosplenomegaly.  No masses. BACK:  No CVA tenderness.  No tenderness on percussion of the back or rib cage. SKIN:  No rashes, ulcers or lesions. EXTREMITIES: No edema, no skin discoloration or tenderness.  No palpable cords. LYMPH NODES: No palpable cervical, supraclavicular, axillary or inguinal adenopathy  NEUROLOGICAL: Unremarkable. PSYCH:  Appropriate.   LAB RESULTS:  Appointment on 04/27/2015  Component Date Value Ref Range Status  . WBC 04/27/2015 4.4  3.6 - 11.0 K/uL Final  . RBC 04/27/2015 2.72* 3.80 - 5.20 MIL/uL Final  . Hemoglobin 04/27/2015 8.0* 12.0 - 16.0 g/dL Final  . HCT 04/27/2015 24.9* 35.0 - 47.0 % Final  . MCV 04/27/2015 91.5  80.0 - 100.0 fL Final  . MCH 04/27/2015 29.3  26.0 - 34.0 pg Final  . MCHC 04/27/2015 32.0  32.0 - 36.0 g/dL Final  . RDW 04/27/2015 17.9* 11.5 - 14.5 % Final  . Platelets 04/27/2015 156  150 - 440 K/uL Final  . Neutrophils Relative % 04/27/2015 58   Final  . Neutro Abs 04/27/2015 2.5  1.4 - 6.5 K/uL Final  . Lymphocytes Relative 04/27/2015 28   Final  . Lymphs Abs 04/27/2015 1.2  1.0 - 3.6 K/uL Final  . Monocytes Relative 04/27/2015 12   Final  . Monocytes Absolute 04/27/2015 0.5  0.2 - 0.9 K/uL Final  . Eosinophils Relative 04/27/2015 1   Final  . Eosinophils Absolute 04/27/2015 0.0  0 - 0.7 K/uL Final  . Basophils Relative 04/27/2015 1   Final  . Basophils Absolute 04/27/2015 0.1  0 - 0.1 K/uL Final  . Sodium 04/27/2015 137  135 - 145 mmol/L Final  . Potassium 04/27/2015 3.5  3.5 - 5.1 mmol/L Final  . Chloride 04/27/2015 104  101 - 111 mmol/L Final  . CO2 04/27/2015 28  22 - 32 mmol/L Final  . Glucose, Bld 04/27/2015 100* 65 - 99 mg/dL Final  . BUN 04/27/2015 11  6 - 20 mg/dL Final  . Creatinine, Ser 04/27/2015 0.59  0.44 - 1.00 mg/dL Final  . Calcium 04/27/2015 8.8* 8.9 - 10.3 mg/dL Final  . Total Protein 04/27/2015  7.4  6.5 - 8.1 g/dL Final  . Albumin 04/27/2015 4.0  3.5 - 5.0 g/dL Final  . AST 04/27/2015 25  15 - 41 U/L Final  . ALT 04/27/2015 21  14 - 54 U/L Final  . Alkaline Phosphatase 04/27/2015 41  38 - 126 U/L Final  . Total Bilirubin 04/27/2015 0.8  0.3 - 1.2 mg/dL Final  . GFR calc non Af Amer 04/27/2015 >60  >60 mL/min Final  . GFR calc Af Amer 04/27/2015 >60  >60 mL/min Final   Comment: (NOTE) The eGFR has been calculated using the CKD EPI equation. This calculation has not been validated in all clinical situations. eGFR's persistently <60 mL/min signify possible Chronic Kidney Disease.   . 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  Patient is tolerating treatment very well. Persistent anemia multifactorial Consider  GI workup   Patient expressed understanding and was in agreement with this plan. She also understands that She can call clinic at any time with any questions, concerns, or complaints.    No matching staging information was found for the patient.  Forest Gleason, MD   04/29/2015 6:32 PM

## 2015-05-04 ENCOUNTER — Inpatient Hospital Stay: Payer: Medicare Other

## 2015-05-04 VITALS — BP 91/52 | HR 76 | Temp 97.0°F | Resp 18

## 2015-05-04 DIAGNOSIS — Z5111 Encounter for antineoplastic chemotherapy: Secondary | ICD-10-CM | POA: Diagnosis not present

## 2015-05-04 DIAGNOSIS — C439 Malignant melanoma of skin, unspecified: Secondary | ICD-10-CM

## 2015-05-04 LAB — CBC WITH DIFFERENTIAL/PLATELET
BASOS PCT: 2 %
Basophils Absolute: 0.1 10*3/uL (ref 0–0.1)
EOS ABS: 0 10*3/uL (ref 0–0.7)
EOS PCT: 1 %
HEMATOCRIT: 24.7 % — AB (ref 35.0–47.0)
HEMOGLOBIN: 8.1 g/dL — AB (ref 12.0–16.0)
LYMPHS PCT: 31 %
Lymphs Abs: 1.2 10*3/uL (ref 1.0–3.6)
MCH: 29.6 pg (ref 26.0–34.0)
MCHC: 32.6 g/dL (ref 32.0–36.0)
MCV: 90.8 fL (ref 80.0–100.0)
MONOS PCT: 12 %
Monocytes Absolute: 0.5 10*3/uL (ref 0.2–0.9)
NEUTROS ABS: 2.2 10*3/uL (ref 1.4–6.5)
NEUTROS PCT: 54 %
Platelets: 159 10*3/uL (ref 150–440)
RBC: 2.72 MIL/uL — ABNORMAL LOW (ref 3.80–5.20)
RDW: 18 % — ABNORMAL HIGH (ref 11.5–14.5)
WBC: 3.9 10*3/uL (ref 3.6–11.0)

## 2015-05-04 MED ORDER — PEGINTERFERON ALFA-2B 300 MCG ~~LOC~~ KIT
3.0000 ug/kg | PACK | Freq: Once | SUBCUTANEOUS | Status: AC
  Administered 2015-05-04: 186 ug via SUBCUTANEOUS
  Filled 2015-05-04: qty 0.5

## 2015-05-11 ENCOUNTER — Inpatient Hospital Stay: Payer: Medicare Other | Attending: Oncology

## 2015-05-11 ENCOUNTER — Inpatient Hospital Stay: Payer: Medicare Other

## 2015-05-11 VITALS — BP 93/49 | HR 78 | Temp 97.3°F | Resp 18

## 2015-05-11 DIAGNOSIS — Z5111 Encounter for antineoplastic chemotherapy: Secondary | ICD-10-CM | POA: Insufficient documentation

## 2015-05-11 DIAGNOSIS — C439 Malignant melanoma of skin, unspecified: Secondary | ICD-10-CM

## 2015-05-11 DIAGNOSIS — Z7982 Long term (current) use of aspirin: Secondary | ICD-10-CM | POA: Diagnosis not present

## 2015-05-11 DIAGNOSIS — M069 Rheumatoid arthritis, unspecified: Secondary | ICD-10-CM | POA: Insufficient documentation

## 2015-05-11 DIAGNOSIS — Z79899 Other long term (current) drug therapy: Secondary | ICD-10-CM | POA: Insufficient documentation

## 2015-05-11 DIAGNOSIS — R531 Weakness: Secondary | ICD-10-CM | POA: Diagnosis not present

## 2015-05-11 DIAGNOSIS — D649 Anemia, unspecified: Secondary | ICD-10-CM | POA: Insufficient documentation

## 2015-05-11 DIAGNOSIS — C4352 Malignant melanoma of skin of breast: Secondary | ICD-10-CM | POA: Diagnosis not present

## 2015-05-11 DIAGNOSIS — R5383 Other fatigue: Secondary | ICD-10-CM | POA: Diagnosis not present

## 2015-05-11 LAB — CBC WITH DIFFERENTIAL/PLATELET
BASOS ABS: 0 10*3/uL (ref 0–0.1)
Basophils Relative: 1 %
EOS PCT: 0 %
Eosinophils Absolute: 0 10*3/uL (ref 0–0.7)
HCT: 23.6 % — ABNORMAL LOW (ref 35.0–47.0)
HEMOGLOBIN: 7.7 g/dL — AB (ref 12.0–16.0)
Lymphocytes Relative: 26 %
Lymphs Abs: 1 10*3/uL (ref 1.0–3.6)
MCH: 29.6 pg (ref 26.0–34.0)
MCHC: 32.6 g/dL (ref 32.0–36.0)
MCV: 90.8 fL (ref 80.0–100.0)
MONO ABS: 0.5 10*3/uL (ref 0.2–0.9)
Monocytes Relative: 12 %
Neutro Abs: 2.4 10*3/uL (ref 1.4–6.5)
Neutrophils Relative %: 61 %
Platelets: 153 10*3/uL (ref 150–440)
RBC: 2.6 MIL/uL — ABNORMAL LOW (ref 3.80–5.20)
RDW: 18.1 % — ABNORMAL HIGH (ref 11.5–14.5)
WBC: 4 10*3/uL (ref 3.6–11.0)

## 2015-05-11 MED ORDER — PEGINTERFERON ALFA-2B 300 MCG ~~LOC~~ KIT
3.0000 ug/kg | PACK | Freq: Once | SUBCUTANEOUS | Status: AC
  Administered 2015-05-11: 186 ug via SUBCUTANEOUS
  Filled 2015-05-11: qty 0.5

## 2015-05-15 ENCOUNTER — Other Ambulatory Visit: Payer: Self-pay | Admitting: Oncology

## 2015-05-18 ENCOUNTER — Inpatient Hospital Stay: Payer: Medicare Other

## 2015-05-18 VITALS — BP 104/54 | HR 70 | Resp 20

## 2015-05-18 DIAGNOSIS — C439 Malignant melanoma of skin, unspecified: Secondary | ICD-10-CM

## 2015-05-18 DIAGNOSIS — Z5111 Encounter for antineoplastic chemotherapy: Secondary | ICD-10-CM | POA: Diagnosis not present

## 2015-05-18 MED ORDER — PEGINTERFERON ALFA-2B 300 MCG ~~LOC~~ KIT
186.0000 ug | PACK | Freq: Once | SUBCUTANEOUS | Status: AC
  Administered 2015-05-18: 186 ug via SUBCUTANEOUS
  Filled 2015-05-18: qty 0.5

## 2015-05-25 ENCOUNTER — Inpatient Hospital Stay: Payer: Medicare Other

## 2015-05-25 ENCOUNTER — Inpatient Hospital Stay (HOSPITAL_BASED_OUTPATIENT_CLINIC_OR_DEPARTMENT_OTHER): Payer: Medicare Other | Admitting: Oncology

## 2015-05-25 VITALS — BP 111/67 | HR 72 | Temp 98.2°F | Wt 130.3 lb

## 2015-05-25 DIAGNOSIS — R531 Weakness: Secondary | ICD-10-CM | POA: Diagnosis not present

## 2015-05-25 DIAGNOSIS — C4352 Malignant melanoma of skin of breast: Secondary | ICD-10-CM | POA: Diagnosis not present

## 2015-05-25 DIAGNOSIS — R5383 Other fatigue: Secondary | ICD-10-CM | POA: Diagnosis not present

## 2015-05-25 DIAGNOSIS — Z7982 Long term (current) use of aspirin: Secondary | ICD-10-CM

## 2015-05-25 DIAGNOSIS — C439 Malignant melanoma of skin, unspecified: Secondary | ICD-10-CM

## 2015-05-25 DIAGNOSIS — D649 Anemia, unspecified: Secondary | ICD-10-CM

## 2015-05-25 DIAGNOSIS — Z79899 Other long term (current) drug therapy: Secondary | ICD-10-CM

## 2015-05-25 DIAGNOSIS — Z5111 Encounter for antineoplastic chemotherapy: Secondary | ICD-10-CM | POA: Diagnosis not present

## 2015-05-25 DIAGNOSIS — M069 Rheumatoid arthritis, unspecified: Secondary | ICD-10-CM

## 2015-05-25 LAB — CBC WITH DIFFERENTIAL/PLATELET
BASOS ABS: 0.1 10*3/uL (ref 0–0.1)
Basophils Relative: 1 %
EOS ABS: 0 10*3/uL (ref 0–0.7)
Eosinophils Relative: 0 %
HCT: 26 % — ABNORMAL LOW (ref 35.0–47.0)
Hemoglobin: 8.3 g/dL — ABNORMAL LOW (ref 12.0–16.0)
LYMPHS ABS: 0.9 10*3/uL — AB (ref 1.0–3.6)
Lymphocytes Relative: 22 %
MCH: 29.5 pg (ref 26.0–34.0)
MCHC: 32 g/dL (ref 32.0–36.0)
MCV: 92.2 fL (ref 80.0–100.0)
MONOS PCT: 11 %
Monocytes Absolute: 0.5 10*3/uL (ref 0.2–0.9)
NEUTROS ABS: 2.7 10*3/uL (ref 1.4–6.5)
Neutrophils Relative %: 66 %
Platelets: 167 10*3/uL (ref 150–440)
RBC: 2.82 MIL/uL — ABNORMAL LOW (ref 3.80–5.20)
RDW: 18.5 % — ABNORMAL HIGH (ref 11.5–14.5)
WBC: 4.1 10*3/uL (ref 3.6–11.0)

## 2015-05-25 LAB — COMPREHENSIVE METABOLIC PANEL
ALT: 16 U/L (ref 14–54)
AST: 22 U/L (ref 15–41)
Albumin: 4 g/dL (ref 3.5–5.0)
Alkaline Phosphatase: 39 U/L (ref 38–126)
Anion gap: 5 (ref 5–15)
BUN: 11 mg/dL (ref 6–20)
CHLORIDE: 104 mmol/L (ref 101–111)
CO2: 29 mmol/L (ref 22–32)
CREATININE: 0.61 mg/dL (ref 0.44–1.00)
Calcium: 8.9 mg/dL (ref 8.9–10.3)
GFR calc non Af Amer: >60 mL/min (ref >60)
Glucose, Bld: 107 mg/dL — ABNORMAL HIGH (ref 65–99)
Potassium: 3.3 mmol/L — ABNORMAL LOW (ref 3.5–5.1)
Sodium: 138 mmol/L (ref 135–145)
TOTAL PROTEIN: 7.4 g/dL (ref 6.5–8.1)
Total Bilirubin: 0.8 mg/dL (ref 0.3–1.2)

## 2015-05-25 MED ORDER — PEGINTERFERON ALFA-2B 300 MCG ~~LOC~~ KIT
3.0000 ug/kg | PACK | Freq: Once | SUBCUTANEOUS | Status: AC
  Administered 2015-05-25: 180 ug via SUBCUTANEOUS
  Filled 2015-05-25: qty 0.5

## 2015-05-25 NOTE — Progress Notes (Signed)
Patient does not have living will.  Never smoked. 

## 2015-05-26 ENCOUNTER — Encounter: Payer: Self-pay | Admitting: Oncology

## 2015-05-26 NOTE — Progress Notes (Signed)
[place] @ [facility] [phone]  Fax:(336) [place]: 31-Mar-1946  MR#: [id number]  LGX#:[id number]  Patient Care Team: Glendon Axe, MD as PCP - General (Internal Medicine)  CHIEF COMPLAINT:  Chief Complaint  Patient presents with  . Follow-up    Oncology History   Chief Complaint/Diagnosis:   69 year old female with newly diagnosed invasive melanoma of right breast. 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 Earlier this year this lesion started increasing in size and ulcerating. Patient had a mammogram done at [facility] facility October 2014 reported as benign. Seen by Dr. 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 BRAF V600E negative patient medical history significant for rheumatoid arthritis on methotrexate and Enbrel also significant for hypertension and neurofibromatosis.     [name]    Oncology Flowsheet 04/13/2015 04/20/2015 04/27/2015 05/04/2015 05/11/2015 05/18/2015 05/25/2015  peginterferon alfa-2b (SYLATRON) [place] 3 mcg/kg 192 mcg 300 mcg 3 mcg/kg 3 mcg/kg 186 mcg 3 mcg/kg    INTERVAL HISTORY:  69 year old lady came today further follow-up regarding continuation of sylatron .  No chills.  No fever.  No nausea.  No vomiting.  No diarrhea. Patient is here for continuation of interferon therapy.  Continues to feel weak tired.  Anemic patient did have colonoscopy done May 25, 2015 Patient is here for ongoing evaluation and continuation of treatment.  No chills.  No fever.  Continues to feel somewhat weak and tired.  Anemia persists.  REVIEW OF SYSTEMS:   GENERAL:  Feels good.  Active.  No fevers, sweats or weight loss. PERFORMANCE STATUS (ECOG):  1 HEENT:  No visual  changes, runny nose, sore throat, mouth sores or tenderness. Lungs: No shortness of breath or cough.  No hemoptysis. Cardiac:  No chest pain, palpitations, orthopnea, or PND. GI:  No nausea, vomiting, diarrhea, constipation, melena or hematochezia. GU:  No urgency, frequency, dysuria, or hematuria. Musculoskeletal:  No back pain.  No joint pain.  No muscle tenderness. Extremities:  No pain or swelling. Skin:  No rashes or skin changes. Neuro:  No headache, numbness or weakness, balance or coordination issues. Endocrine:  No diabetes, thyroid issues, hot flashes or night sweats. Psych:  No mood changes, depression or anxiety. Pain:  No focal pain. Review of systems:  All other systems reviewed and found to be negative.t  As per HPI. Otherwise, a complete review of systems is negatve.    PAST SURGICAL HISTORY: Preventive Screening:  Has patient had any of the following test? Colonscopy  Mammography  Pap Smear (1)   Last Colonoscopy: due fall of 2015(1)   Last Mammography: 11/2014(1)   Last Pap Smear: status post hysterectomy 1988(1)   Smoking History: Smoking History Never Smoked.(1)  PFSH: Family History: noncontributory  Social History: negative alcohol, negative tobacco    FAMILY HISTORY No family history on file.  GYNECOLOGIC HISTORY:  No LMP recorded.     ADVANCED DIRECTIVES:    HEALTH MAINTENANCE: History  Substance Use Topics  . Smoking status: Never Smoker   . Smokeless tobacco: Not on file  . Alcohol Use: Not on file     Colonoscopy:  PAP:  Bone density:  Lipid panel:  Allergies  Allergen Reactions  . Rosuvastatin Nausea And Vomiting  .  Sulfa Antibiotics     Other reaction(s): Unknown    Current Outpatient Prescriptions  Medication Sig Dispense Refill  . Acetaminophen 500 MG coapsule Take by mouth.    Marland Kitchen alendronate (FOSAMAX) 70 MG tablet TAKE 1 TABLET BY MOUTH EVERY 7 DAYS. TAKE WITH A FULL GLASS OF WATER AND DO NOT LIE DOWN FOR THE NEXT 30  MINUTES.    Marland Kitchen aspirin EC 81 MG tablet Take by mouth.    . Calcium-Vitamin D 600-200 MG-UNIT per tablet Take by mouth.    . Cholecalciferol 1000 UNITS tablet Take by mouth.    . citalopram (CELEXA) 10 MG tablet TAKE ONE (1) TABLET BY MOUTH ONCE DAILY 30 tablet 5  . citalopram (CELEXA) 10 MG tablet Take by mouth.    . ezetimibe-simvastatin (VYTORIN) 10-20 MG per tablet Take by mouth.    . ferrous sulfate 325 (65 FE) MG EC tablet Take by mouth.    . folic acid (FOLVITE) 1 MG tablet Take by mouth.    . interferon alfa-2b (INTRON-A) [id number] UNITS injection Inject into the skin.    . methotrexate (RHEUMATREX) 2.5 MG tablet Take by mouth.    . mirtazapine (REMERON) 15 MG tablet TAKE (1) TABLET BY MOUTH DAILY AT BEDTIME 30 tablet 0  . valsartan-hydrochlorothiazide (DIOVAN-HCT) 80-12.5 MG per tablet Take by mouth.    . Multiple Vitamin (MULTI-VITAMINS) TABS Take by mouth.     No current facility-administered medications for this visit.   Facility-Administered Medications Ordered in Other Visits  Medication Dose Route Frequency Provider Last Rate Last Dose  . peginterferon alfa-2b (SYLATRON) injection 198 mcg  3 mcg/kg Subcutaneous Once Forest Gleason, MD      . peginterferon alfa-2b (SYLATRON) injection 198 mcg  3 mcg/kg Subcutaneous Once Forest Gleason, MD        OBJECTIVE:  Filed Vitals:   05/25/15 1032  BP: 111/67  Pulse: 72  Temp: 98.2 F (36.8 C)     Body mass index is 22.16 kg/(m^2).    ECOG FS:1 - Symptomatic but completely ambulatory  PHYSICAL EXAM: GENERAL:  Well developed, well nourished, sitting comfortably in the exam room in no acute distress. MENTAL STATUS:  Alert and oriented to person, place and time. HEAD:  .  Normocephalic, atraumatic, face symmetric, no Cushingoid features. EYES:  .  Pupils equal round and reactive to light and accomodation.  No conjunctivitis or scleral icterus. ENT:  Oropharynx clear without lesion.  Tongue normal. Mucous membranes moist.  RESPIRATORY:   Clear to auscultation without rales, wheezes or rhonchi. CARDIOVASCULAR:  Regular rate and rhythm without murmur, rub or gallop. BREAST:  Right breast : Mastectomy.  Chest wall there is no evidence of recurrent disease.  Left breast free of masses.  ABDOMEN:  Soft, non-tender, with active bowel sounds, and no hepatosplenomegaly.  No masses. BACK:  No CVA tenderness.  No tenderness on percussion of the back or rib cage. SKIN:  No rashes, ulcers or lesions. EXTREMITIES: No edema, no skin discoloration or tenderness.  No palpable cords. LYMPH NODES: No palpable cervical, supraclavicular, axillary or inguinal adenopathy  NEUROLOGICAL: Unremarkable. PSYCH:  Appropriate.   LAB RESULTS:  Appointment on 05/25/2015  Component Date Value Ref Range Status  . WBC 05/25/2015 4.1  3.6 - 11.0 K/uL Final  . RBC 05/25/2015 2.82* 3.80 - 5.20 MIL/uL Final  . Hemoglobin 05/25/2015 8.3* 12.0 - 16.0 g/dL Final  . HCT 05/25/2015 26.0* 35.0 - 47.0 % Final  . MCV 05/25/2015 92.2  80.0 - 100.0 fL  Final  . MCH 05/25/2015 29.5  26.0 - 34.0 pg Final  . MCHC 05/25/2015 32.0  32.0 - 36.0 g/dL Final  . RDW 05/25/2015 18.5* 11.5 - 14.5 % Final  . Platelets 05/25/2015 167  150 - 440 K/uL Final  . Neutrophils Relative % 05/25/2015 66   Final  . Neutro Abs 05/25/2015 2.7  1.4 - 6.5 K/uL Final  . Lymphocytes Relative 05/25/2015 22   Final  . Lymphs Abs 05/25/2015 0.9* 1.0 - 3.6 K/uL Final  . Monocytes Relative 05/25/2015 11   Final  . Monocytes Absolute 05/25/2015 0.5  0.2 - 0.9 K/uL Final  . Eosinophils Relative 05/25/2015 0   Final  . Eosinophils Absolute 05/25/2015 0.0  0 - 0.7 K/uL Final  . Basophils Relative 05/25/2015 1   Final  . Basophils Absolute 05/25/2015 0.1  0 - 0.1 K/uL Final  . Sodium 05/25/2015 138  135 - 145 mmol/L Final  . Potassium 05/25/2015 3.3* 3.5 - 5.1 mmol/L Final  . Chloride 05/25/2015 104  101 - 111 mmol/L Final  . CO2 05/25/2015 29  22 - 32 mmol/L Final  . Glucose, Bld 05/25/2015 107*  65 - 99 mg/dL Final  . BUN 05/25/2015 11  6 - 20 mg/dL Final  . Creatinine, Ser 05/25/2015 0.61  0.44 - 1.00 mg/dL Final  . Calcium 05/25/2015 8.9  8.9 - 10.3 mg/dL Final  . Total Protein 05/25/2015 7.4  6.5 - 8.1 g/dL Final  . Albumin 05/25/2015 4.0  3.5 - 5.0 g/dL Final  . AST 05/25/2015 22  15 - 41 U/L Final  . ALT 05/25/2015 16  14 - 54 U/L Final  . Alkaline Phosphatase 05/25/2015 39  38 - 126 U/L Final  . Total Bilirubin 05/25/2015 0.8  0.3 - 1.2 mg/dL Final  . GFR calc non Af Amer 05/25/2015 >60  >60 mL/min Final  . GFR calc Af Amer 05/25/2015 >60  >60 mL/min Final   Comment: (NOTE) The eGFR has been calculated using the CKD EPI equation. This calculation has not been validated in all clinical situations. eGFR's persistently <60 mL/min signify possible Chronic Kidney Disease.   . Anion gap 05/25/2015 5  5 - 15 Final       ASSESSMENT: Right breast melanoma status post resection on adjuvant sylatron   anemia  multi factorial MEDICAL DECISION MAKING:  All lab data has been reviewed.  Patient is tolerating treatment very well. Persistent anemia multifactorial Consider  GI workup   Patient expressed understanding and was in agreement with this plan. She also understands that She can call clinic at any time with any questions, concerns, or complaints.    No matching staging information was found for the patient.  Forest Gleason, MD   05/26/2015 2:28 PM

## 2015-05-31 ENCOUNTER — Inpatient Hospital Stay: Payer: Medicare Other

## 2015-05-31 VITALS — BP 103/51 | HR 74 | Temp 97.8°F | Resp 20

## 2015-05-31 DIAGNOSIS — C439 Malignant melanoma of skin, unspecified: Secondary | ICD-10-CM

## 2015-05-31 DIAGNOSIS — Z5111 Encounter for antineoplastic chemotherapy: Secondary | ICD-10-CM | POA: Diagnosis not present

## 2015-05-31 LAB — CBC WITH DIFFERENTIAL/PLATELET
Basophils Absolute: 0 10*3/uL (ref 0–0.1)
Basophils Relative: 0 %
EOS ABS: 0 10*3/uL (ref 0–0.7)
Eosinophils Relative: 0 %
HCT: 24.3 % — ABNORMAL LOW (ref 35.0–47.0)
HEMOGLOBIN: 7.9 g/dL — AB (ref 12.0–16.0)
Lymphocytes Relative: 21 %
Lymphs Abs: 1 10*3/uL (ref 1.0–3.6)
MCH: 29.8 pg (ref 26.0–34.0)
MCHC: 32.5 g/dL (ref 32.0–36.0)
MCV: 91.7 fL (ref 80.0–100.0)
MONOS PCT: 9 %
Monocytes Absolute: 0.4 10*3/uL (ref 0.2–0.9)
Neutro Abs: 3.4 10*3/uL (ref 1.4–6.5)
Neutrophils Relative %: 70 %
Platelets: 138 10*3/uL — ABNORMAL LOW (ref 150–440)
RBC: 2.65 MIL/uL — ABNORMAL LOW (ref 3.80–5.20)
RDW: 18.2 % — ABNORMAL HIGH (ref 11.5–14.5)
WBC: 4.9 10*3/uL (ref 3.6–11.0)

## 2015-05-31 MED ORDER — PEGINTERFERON ALFA-2B 300 MCG ~~LOC~~ KIT
3.0000 ug/kg | PACK | Freq: Once | SUBCUTANEOUS | Status: AC
  Administered 2015-05-31: 180 ug via SUBCUTANEOUS

## 2015-06-01 ENCOUNTER — Inpatient Hospital Stay: Payer: Medicare Other

## 2015-06-07 ENCOUNTER — Encounter: Payer: Self-pay | Admitting: Radiation Oncology

## 2015-06-07 ENCOUNTER — Ambulatory Visit
Admission: RE | Admit: 2015-06-07 | Discharge: 2015-06-07 | Disposition: A | Discharge status: Home / Self Care | Payer: Medicare Other | Source: Ambulatory Visit | Attending: Radiation Oncology | Admitting: Radiation Oncology

## 2015-06-07 VITALS — BP 108/69 | HR 71 | Temp 95.9°F | Resp 18 | Wt 129.6 lb

## 2015-06-07 DIAGNOSIS — C439 Malignant melanoma of skin, unspecified: Secondary | ICD-10-CM

## 2015-06-07 HISTORY — DX: Essential (primary) hypertension: I10

## 2015-06-07 HISTORY — DX: Unspecified malignant neoplasm of skin, unspecified: C44.90

## 2015-06-07 NOTE — Progress Notes (Signed)
Radiation Oncology Follow up Note  Name: Kristina Pittman   Date:   06/07/2015 MRN:  [id number] DOB: 1946/07/30    This 69 y.o. female presents to the clinic today for follow-up for melanoma of the right breast status post right modified radical mastectomy.  REFERRING PROVIDER: No ref. provider found  HPI: patient is a 69 year old female now out 1 year having completed radiation therapy to her right chest wall and peripheral lymphatics for a stage TIV be Clark's level V imalignant melanoma. Patient has comorbidities including rheumatoid arthritis, hypertension and neurofibromatosis.she had extensive lymph node dissection showing no evidence of metastatic disease we only treated her chest wall and did not include her peripheral lymphatics.she continues to be followed by medical oncology has some persistent anemia which is being investigated. She specifically denies chest wall tenderness cough or bone pain.  COMPLICATIONS OF TREATMENT: none  FOLLOW UP COMPLIANCE: keeps appointments   PHYSICAL EXAM:  BP 108/69 mmHg  Pulse 71  Temp(Src) 95.9 F (35.5 C)  Resp 18  Wt 129 lb 10.1 oz (58.8 kg) Patient is status post right modified radical mastectomy. No evidence of mass or nodularity in the scar is noted. No axillary or supra clavicular adenopathy is identified left breast is free of dominant mass or nodularity in 2 positions examined. She is extensive neurofibromata fibromas of the skin. Well-developed well-nourished patient in NAD. HEENT reveals PERLA, EOMI, discs not visualized.  Oral cavity is clear. No oral mucosal lesions are identified. Neck is clear without evidence of cervical or supraclavicular adenopathy. Lungs are clear to A&P. Cardiac examination is essentially unremarkable with regular rate and rhythm without murmur rub or thrill. Abdomen is benign with no organomegaly or masses noted. Motor sensory and DTR levels are equal and symmetric in the upper and lower extremities. Cranial nerves  II through XII are grossly intact. Proprioception is intact. No peripheral adenopathy or edema is identified. No motor or sensory levels are noted. Crude visual fields are within normal range.   RADIOLOGY RESULTS: no recent films for review  PLAN: at the present time she continues to do well with no evidence of disease. I've asked to see her back in 1 year for follow-up. She continues close follow-up care with medical oncology. I believe she's been have a GI investigation to rule out possible source of her anemia. Patient knows to call with any concerns.  I would like to take this opportunity for allowing me to participate in the care of your patient.Armstead Peaks., MD

## 2015-06-08 ENCOUNTER — Inpatient Hospital Stay: Payer: Medicare Other

## 2015-06-08 ENCOUNTER — Inpatient Hospital Stay: Payer: Medicare Other | Attending: Oncology

## 2015-06-08 DIAGNOSIS — C4352 Malignant melanoma of skin of breast: Secondary | ICD-10-CM | POA: Diagnosis not present

## 2015-06-08 DIAGNOSIS — M069 Rheumatoid arthritis, unspecified: Secondary | ICD-10-CM | POA: Diagnosis not present

## 2015-06-08 DIAGNOSIS — R531 Weakness: Secondary | ICD-10-CM | POA: Insufficient documentation

## 2015-06-08 DIAGNOSIS — Z7982 Long term (current) use of aspirin: Secondary | ICD-10-CM | POA: Diagnosis not present

## 2015-06-08 DIAGNOSIS — Z79899 Other long term (current) drug therapy: Secondary | ICD-10-CM | POA: Diagnosis not present

## 2015-06-08 DIAGNOSIS — C439 Malignant melanoma of skin, unspecified: Secondary | ICD-10-CM

## 2015-06-08 DIAGNOSIS — D649 Anemia, unspecified: Secondary | ICD-10-CM | POA: Insufficient documentation

## 2015-06-08 DIAGNOSIS — R5383 Other fatigue: Secondary | ICD-10-CM | POA: Insufficient documentation

## 2015-06-08 DIAGNOSIS — Q8500 Neurofibromatosis, unspecified: Secondary | ICD-10-CM | POA: Diagnosis not present

## 2015-06-08 DIAGNOSIS — I10 Essential (primary) hypertension: Secondary | ICD-10-CM | POA: Diagnosis not present

## 2015-06-08 DIAGNOSIS — Z5111 Encounter for antineoplastic chemotherapy: Secondary | ICD-10-CM | POA: Insufficient documentation

## 2015-06-08 LAB — CBC WITH DIFFERENTIAL/PLATELET
Basophils Absolute: 0 10*3/uL (ref 0–0.1)
Basophils Relative: 1 %
EOS ABS: 0 10*3/uL (ref 0–0.7)
EOS PCT: 0 %
HCT: 24.4 % — ABNORMAL LOW (ref 35.0–47.0)
Hemoglobin: 8 g/dL — ABNORMAL LOW (ref 12.0–16.0)
LYMPHS ABS: 1.1 10*3/uL (ref 1.0–3.6)
Lymphocytes Relative: 23 %
MCH: 29.8 pg (ref 26.0–34.0)
MCHC: 32.7 g/dL (ref 32.0–36.0)
MCV: 90.9 fL (ref 80.0–100.0)
MONOS PCT: 10 %
Monocytes Absolute: 0.5 10*3/uL (ref 0.2–0.9)
Neutro Abs: 3.1 10*3/uL (ref 1.4–6.5)
Neutrophils Relative %: 66 %
Platelets: 145 10*3/uL — ABNORMAL LOW (ref 150–440)
RBC: 2.69 MIL/uL — ABNORMAL LOW (ref 3.80–5.20)
RDW: 18.2 % — ABNORMAL HIGH (ref 11.5–14.5)
WBC: 4.7 10*3/uL (ref 3.6–11.0)

## 2015-06-08 MED ORDER — PEGINTERFERON ALFA-2B 300 MCG ~~LOC~~ KIT
180.0000 ug | PACK | Freq: Once | SUBCUTANEOUS | Status: AC
  Administered 2015-06-08: 180 ug via SUBCUTANEOUS

## 2015-06-15 ENCOUNTER — Inpatient Hospital Stay: Payer: Medicare Other

## 2015-06-15 ENCOUNTER — Ambulatory Visit: Payer: Medicare Other

## 2015-06-15 ENCOUNTER — Other Ambulatory Visit: Payer: Self-pay | Admitting: Oncology

## 2015-06-15 VITALS — BP 95/64 | HR 71 | Temp 98.1°F | Resp 18

## 2015-06-15 DIAGNOSIS — Z5111 Encounter for antineoplastic chemotherapy: Secondary | ICD-10-CM | POA: Diagnosis not present

## 2015-06-15 DIAGNOSIS — C439 Malignant melanoma of skin, unspecified: Secondary | ICD-10-CM

## 2015-06-15 LAB — CBC WITH DIFFERENTIAL/PLATELET
Basophils Absolute: 0.1 10*3/uL (ref 0–0.1)
Basophils Relative: 2 %
Eosinophils Absolute: 0 10*3/uL (ref 0–0.7)
Eosinophils Relative: 0 %
HEMATOCRIT: 24.3 % — AB (ref 35.0–47.0)
Hemoglobin: 7.9 g/dL — ABNORMAL LOW (ref 12.0–16.0)
LYMPHS ABS: 1 10*3/uL (ref 1.0–3.6)
LYMPHS PCT: 24 %
MCH: 29.6 pg (ref 26.0–34.0)
MCHC: 32.7 g/dL (ref 32.0–36.0)
MCV: 90.6 fL (ref 80.0–100.0)
Monocytes Absolute: 0.5 10*3/uL (ref 0.2–0.9)
Monocytes Relative: 13 %
NEUTROS ABS: 2.6 10*3/uL (ref 1.4–6.5)
NEUTROS PCT: 61 %
PLATELETS: 151 10*3/uL (ref 150–440)
RBC: 2.68 MIL/uL — ABNORMAL LOW (ref 3.80–5.20)
RDW: 17.8 % — ABNORMAL HIGH (ref 11.5–14.5)
WBC: 4.2 10*3/uL (ref 3.6–11.0)

## 2015-06-15 MED ORDER — PEGINTERFERON ALFA-2B 300 MCG ~~LOC~~ KIT
3.0000 ug/kg | PACK | Freq: Once | SUBCUTANEOUS | Status: AC
  Administered 2015-06-15: 198 ug via SUBCUTANEOUS
  Filled 2015-06-15: qty 0.5

## 2015-06-22 ENCOUNTER — Inpatient Hospital Stay (HOSPITAL_BASED_OUTPATIENT_CLINIC_OR_DEPARTMENT_OTHER): Payer: Medicare Other | Admitting: Oncology

## 2015-06-22 ENCOUNTER — Encounter: Payer: Self-pay | Admitting: Oncology

## 2015-06-22 ENCOUNTER — Inpatient Hospital Stay: Payer: Medicare Other

## 2015-06-22 VITALS — BP 108/70 | HR 86 | Temp 95.9°F | Wt 127.2 lb

## 2015-06-22 DIAGNOSIS — C439 Malignant melanoma of skin, unspecified: Secondary | ICD-10-CM

## 2015-06-22 DIAGNOSIS — C4352 Malignant melanoma of skin of breast: Secondary | ICD-10-CM

## 2015-06-22 DIAGNOSIS — R531 Weakness: Secondary | ICD-10-CM | POA: Diagnosis not present

## 2015-06-22 DIAGNOSIS — I10 Essential (primary) hypertension: Secondary | ICD-10-CM

## 2015-06-22 DIAGNOSIS — M069 Rheumatoid arthritis, unspecified: Secondary | ICD-10-CM

## 2015-06-22 DIAGNOSIS — Z5111 Encounter for antineoplastic chemotherapy: Secondary | ICD-10-CM | POA: Diagnosis not present

## 2015-06-22 DIAGNOSIS — Z79899 Other long term (current) drug therapy: Secondary | ICD-10-CM

## 2015-06-22 DIAGNOSIS — D649 Anemia, unspecified: Secondary | ICD-10-CM | POA: Diagnosis not present

## 2015-06-22 DIAGNOSIS — Q8500 Neurofibromatosis, unspecified: Secondary | ICD-10-CM

## 2015-06-22 DIAGNOSIS — Z7982 Long term (current) use of aspirin: Secondary | ICD-10-CM

## 2015-06-22 DIAGNOSIS — R5383 Other fatigue: Secondary | ICD-10-CM

## 2015-06-22 LAB — CBC WITH DIFFERENTIAL/PLATELET
Basophils Absolute: 0 10*3/uL (ref 0–0.1)
Basophils Relative: 1 %
Eosinophils Absolute: 0 10*3/uL (ref 0–0.7)
Eosinophils Relative: 0 %
HCT: 26.4 % — ABNORMAL LOW (ref 35.0–47.0)
HEMOGLOBIN: 8.8 g/dL — AB (ref 12.0–16.0)
LYMPHS ABS: 0.8 10*3/uL — AB (ref 1.0–3.6)
LYMPHS PCT: 21 %
MCH: 29.8 pg (ref 26.0–34.0)
MCHC: 33.3 g/dL (ref 32.0–36.0)
MCV: 89.4 fL (ref 80.0–100.0)
Monocytes Absolute: 0.4 10*3/uL (ref 0.2–0.9)
Monocytes Relative: 10 %
NEUTROS PCT: 68 %
Neutro Abs: 2.8 10*3/uL (ref 1.4–6.5)
Platelets: 160 10*3/uL (ref 150–440)
RBC: 2.95 MIL/uL — AB (ref 3.80–5.20)
RDW: 17.6 % — ABNORMAL HIGH (ref 11.5–14.5)
WBC: 4.1 10*3/uL (ref 3.6–11.0)

## 2015-06-22 LAB — COMPREHENSIVE METABOLIC PANEL
ALT: 18 U/L (ref 14–54)
AST: 22 U/L (ref 15–41)
Albumin: 4.2 g/dL (ref 3.5–5.0)
Alkaline Phosphatase: 40 U/L (ref 38–126)
Anion gap: 6 (ref 5–15)
BUN: 11 mg/dL (ref 6–20)
CO2: 27 mmol/L (ref 22–32)
CREATININE: 0.6 mg/dL (ref 0.44–1.00)
Calcium: 8.7 mg/dL — ABNORMAL LOW (ref 8.9–10.3)
Chloride: 103 mmol/L (ref 101–111)
Glucose, Bld: 100 mg/dL — ABNORMAL HIGH (ref 65–99)
Potassium: 3.4 mmol/L — ABNORMAL LOW (ref 3.5–5.1)
Sodium: 136 mmol/L (ref 135–145)
Total Bilirubin: 0.7 mg/dL (ref 0.3–1.2)
Total Protein: 7.6 g/dL (ref 6.5–8.1)

## 2015-06-22 LAB — MAGNESIUM: MAGNESIUM: 1.9 mg/dL (ref 1.7–2.4)

## 2015-06-22 MED ORDER — PEGINTERFERON ALFA-2B 300 MCG ~~LOC~~ KIT
3.0000 ug/kg | PACK | Freq: Once | SUBCUTANEOUS | Status: AC
  Administered 2015-06-22: 174 ug via SUBCUTANEOUS
  Filled 2015-06-22: qty 0.5

## 2015-06-22 NOTE — Progress Notes (Unsigned)
Patient had a question if she could stop medication that she was taking to offset side effects of sylatron injection since this will be here last. Spoke with Dr. Metro Kung nurse [facility] who states Dr Oliva Bustard said for patient to take medication for one more month and then stop (Celexa).

## 2015-06-22 NOTE — Progress Notes (Signed)
Patient does not have living will.  Former smoker. 

## 2015-06-23 ENCOUNTER — Encounter: Payer: Self-pay | Admitting: Oncology

## 2015-06-23 NOTE — Progress Notes (Signed)
[place] @ [facility] [phone]  Fax:(336) [place]: Sep 08, 1946  MR#: [id number]  NOI#:[id number]  Patient Care Team: Glendon Axe, MD as PCP - General (Internal Medicine)  CHIEF COMPLAINT:  Chief Complaint  Patient presents with  . Follow-up    Oncology History   Chief Complaint/Diagnosis:   69 year old female with newly diagnosed invasive melanoma of right breast. 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 Earlier this year this lesion started increasing in size and ulcerating. Patient had a mammogram done at [facility] facility October 2014 reported as benign. Seen by Dr. 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 BRAF V600E negative patient medical history significant for rheumatoid arthritis on methotrexate and Enbrel also significant for hypertension and neurofibromatosis. 2.sylatron had been discontinued in August of 2016     [name]    Oncology Flowsheet 05/11/2015 05/18/2015 05/25/2015 05/31/2015 06/08/2015 06/15/2015 06/22/2015  peginterferon alfa-2b (SYLATRON) [place] 3 mcg/kg 186 mcg 3 mcg/kg 3 mcg/kg 180 mcg 3 mcg/kg 3 mcg/kg    INTERVAL HISTORY:  69 year old lady came today further follow-up regarding continuation of sylatron .  No chills.  No fever.  No nausea.  No vomiting.  No diarrhea. Patient is here for continuation of interferon therapy.  Continues to feel weak tired.  Anemic patient did have colonoscopy done May 25, 2015 Patient is here for ongoing evaluation and continuation of treatment.  No chills.  No fever.  Continues to feel somewhat weak and tired.  Anemia persists. June 22, 2015 Patient is here for ongoing evaluation and continuation of interferon  therapy.  Tolerating treatment very well.  Patient has persistent anemia.  Weight loss.  No nausea no vomiting patient has almost finished one year off interferon therapy and desiring to stop treatment.  REVIEW OF SYSTEMS:   GENERAL:  Feels good.  Active.  No fevers, sweats or weight loss. PERFORMANCE STATUS (ECOG):  1 HEENT:  No visual changes, runny nose, sore throat, mouth sores or tenderness. Lungs: No shortness of breath or cough.  No hemoptysis. Cardiac:  No chest pain, palpitations, orthopnea, or PND. GI:  No nausea, vomiting, diarrhea, constipation, melena or hematochezia. GU:  No urgency, frequency, dysuria, or hematuria. Musculoskeletal:  No back pain.  No joint pain.  No muscle tenderness. Extremities:  No pain or swelling. Skin:  No rashes or skin changes. Neuro:  No headache, numbness or weakness, balance or coordination issues. Endocrine:  No diabetes, thyroid issues, hot flashes or night sweats. Psych:  No mood changes, depression or anxiety. Pain:  No focal pain. Review of systems:  All other systems reviewed and found to be negative.t  As per HPI. Otherwise, a complete review of systems is negatve.    PAST SURGICAL HISTORY: Preventive Screening:  Has patient had any of the following test? Colonscopy  Mammography  Pap Smear (1)   Last Colonoscopy: due fall of 2015(1)   Last Mammography: 11/2014(1)   Last Pap Smear: status post hysterectomy 1988(1)   Smoking History: Smoking History Never Smoked.(1)  PFSH: Family History: noncontributory  Social History: negative alcohol, negative tobacco    FAMILY HISTORY No family history on file.  GYNECOLOGIC HISTORY:  No LMP recorded.     ADVANCED DIRECTIVES:  HEALTH MAINTENANCE: Social History  Substance Use Topics  . Smoking status: Never Smoker   . Smokeless tobacco: None  . Alcohol Use: None     Colonoscopy:  PAP:  Bone density:  Lipid panel:  Allergies  Allergen Reactions  . Rosuvastatin  Nausea And Vomiting  . Sulfa Antibiotics     Other reaction(s): Unknown    Current Outpatient Prescriptions  Medication Sig Dispense Refill  . Acetaminophen 500 MG coapsule Take by mouth.    Marland Kitchen alendronate (FOSAMAX) 70 MG tablet TAKE 1 TABLET BY MOUTH EVERY 7 DAYS. TAKE WITH A FULL GLASS OF WATER AND DO NOT LIE DOWN FOR THE NEXT 30 MINUTES.    Marland Kitchen aspirin EC 81 MG tablet Take by mouth.    . Calcium-Vitamin D 600-200 MG-UNIT per tablet Take by mouth.    . Cholecalciferol 1000 UNITS tablet Take by mouth.    . citalopram (CELEXA) 10 MG tablet TAKE ONE (1) TABLET BY MOUTH ONCE DAILY 30 tablet 5  . ezetimibe-simvastatin (VYTORIN) 10-20 MG per tablet Take by mouth.    . ferrous sulfate 325 (65 FE) MG EC tablet Take by mouth.    . folic acid (FOLVITE) 1 MG tablet Take by mouth.    . methotrexate (RHEUMATREX) 2.5 MG tablet Take by mouth.    . mirtazapine (REMERON) 15 MG tablet TAKE (1) TABLET BY MOUTH DAILY AT BEDTIME 30 tablet 0  . Multiple Vitamin (MULTI-VITAMINS) TABS Take by mouth.    . valsartan-hydrochlorothiazide (DIOVAN-HCT) 80-12.5 MG per tablet Take by mouth.    . interferon alfa-2b (INTRON-A) [id number] UNITS injection Inject into the skin.     No current facility-administered medications for this visit.   Facility-Administered Medications Ordered in Other Visits  Medication Dose Route Frequency Provider Last Rate Last Dose  . peginterferon alfa-2b (SYLATRON) injection 198 mcg  3 mcg/kg Subcutaneous Once Forest Gleason, MD      . peginterferon alfa-2b (SYLATRON) injection 198 mcg  3 mcg/kg Subcutaneous Once Forest Gleason, MD        OBJECTIVE:  Filed Vitals:   06/22/15 1028  BP: 108/70  Pulse: 86  Temp: 95.9 F (35.5 C)     Body mass index is 21.64 kg/(m^2).    ECOG FS:1 - Symptomatic but completely ambulatory  PHYSICAL EXAM: GENERAL:  Well developed, well nourished, sitting comfortably in the exam room in no acute distress. MENTAL STATUS:  Alert and oriented to person, place and  time. HEAD:  .  Normocephalic, atraumatic, face symmetric, no Cushingoid features. EYES:  .  Pupils equal round and reactive to light and accomodation.  No conjunctivitis or scleral icterus. ENT:  Oropharynx clear without lesion.  Tongue normal. Mucous membranes moist.  RESPIRATORY:  Clear to auscultation without rales, wheezes or rhonchi. CARDIOVASCULAR:  Regular rate and rhythm without murmur, rub or gallop. BREAST:  Right breast : Mastectomy.  Chest wall there is no evidence of recurrent disease.  Left breast free of masses.. Chest wall area no evidence of recurrent disease  ABDOMEN:  Soft, non-tender, with active bowel sounds, and no hepatosplenomegaly.  No masses. BACK:  No CVA tenderness.  No tenderness on percussion of the back or rib cage. SKIN:  No rashes, ulcers or lesions. EXTREMITIES: No edema, no skin discoloration or tenderness.  No palpable cords. LYMPH NODES: No palpable cervical, supraclavicular, axillary or inguinal adenopathy  NEUROLOGICAL: Unremarkable. PSYCH:  Appropriate.   LAB RESULTS:  Appointment on 06/22/2015  Component Date Value Ref Range Status  . WBC 06/22/2015  4.1  3.6 - 11.0 K/uL Final  . RBC 06/22/2015 2.95* 3.80 - 5.20 MIL/uL Final  . Hemoglobin 06/22/2015 8.8* 12.0 - 16.0 g/dL Final  . HCT 06/22/2015 26.4* 35.0 - 47.0 % Final  . MCV 06/22/2015 89.4  80.0 - 100.0 fL Final  . MCH 06/22/2015 29.8  26.0 - 34.0 pg Final  . MCHC 06/22/2015 33.3  32.0 - 36.0 g/dL Final  . RDW 06/22/2015 17.6* 11.5 - 14.5 % Final  . Platelets 06/22/2015 160  150 - 440 K/uL Final  . Neutrophils Relative % 06/22/2015 68   Final  . Neutro Abs 06/22/2015 2.8  1.4 - 6.5 K/uL Final  . Lymphocytes Relative 06/22/2015 21   Final  . Lymphs Abs 06/22/2015 0.8* 1.0 - 3.6 K/uL Final  . Monocytes Relative 06/22/2015 10   Final  . Monocytes Absolute 06/22/2015 0.4  0.2 - 0.9 K/uL Final  . Eosinophils Relative 06/22/2015 0   Final  . Eosinophils Absolute 06/22/2015 0.0  0 - 0.7 K/uL  Final  . Basophils Relative 06/22/2015 1   Final  . Basophils Absolute 06/22/2015 0.0  0 - 0.1 K/uL Final  . Sodium 06/22/2015 136  135 - 145 mmol/L Final  . Potassium 06/22/2015 3.4* 3.5 - 5.1 mmol/L Final  . Chloride 06/22/2015 103  101 - 111 mmol/L Final  . CO2 06/22/2015 27  22 - 32 mmol/L Final  . Glucose, Bld 06/22/2015 100* 65 - 99 mg/dL Final  . BUN 06/22/2015 11  6 - 20 mg/dL Final  . Creatinine, Ser 06/22/2015 0.60  0.44 - 1.00 mg/dL Final  . Calcium 06/22/2015 8.7* 8.9 - 10.3 mg/dL Final  . Total Protein 06/22/2015 7.6  6.5 - 8.1 g/dL Final  . Albumin 06/22/2015 4.2  3.5 - 5.0 g/dL Final  . AST 06/22/2015 22  15 - 41 U/L Final  . ALT 06/22/2015 18  14 - 54 U/L Final  . Alkaline Phosphatase 06/22/2015 40  38 - 126 U/L Final  . Total Bilirubin 06/22/2015 0.7  0.3 - 1.2 mg/dL Final  . GFR calc non Af Amer 06/22/2015 >60  >60 mL/min Final  . GFR calc Af Amer 06/22/2015 >60  >60 mL/min Final   Comment: (NOTE) The eGFR has been calculated using the CKD EPI equation. This calculation has not been validated in all clinical situations. eGFR's persistently <60 mL/min signify possible Chronic Kidney Disease.   . Anion gap 06/22/2015 6  5 - 15 Final  . Magnesium 06/22/2015 1.9  1.7 - 2.4 mg/dL Final       ASSESSMENT: Right breast melanoma status post resection on adjuvant sylatron   anemia  multi factorial Reassessment of anemia with iron studies prior to next appointment The patient did have colonoscopy in the past MEDICAL DECISION MAKING:  All lab data has been reviewed.  Patient is tolerating treatment very well. Persistent anemia multifactorial After this treatment patient will be reevaluated in 3 months with a repeat PET scan.   In view of persistent fatigue weakness and patient's strong desire to stop interferon therapy will discontinue treatment at present time  Patient expressed understanding and was in agreement with this plan. She also understands that She can call  clinic at any time with any questions, concerns, or complaints.    No matching staging information was found for the patient.  Forest Gleason, MD   06/23/2015 7:58 AM

## 2015-09-21 ENCOUNTER — Encounter: Payer: Self-pay | Admitting: Oncology

## 2015-09-21 ENCOUNTER — Inpatient Hospital Stay (HOSPITAL_BASED_OUTPATIENT_CLINIC_OR_DEPARTMENT_OTHER): Payer: Medicare Other | Admitting: Oncology

## 2015-09-21 ENCOUNTER — Inpatient Hospital Stay: Payer: Medicare Other | Attending: Oncology

## 2015-09-21 VITALS — BP 143/80 | HR 76 | Temp 96.5°F | Wt 147.7 lb

## 2015-09-21 DIAGNOSIS — I10 Essential (primary) hypertension: Secondary | ICD-10-CM | POA: Diagnosis not present

## 2015-09-21 DIAGNOSIS — Z23 Encounter for immunization: Secondary | ICD-10-CM | POA: Insufficient documentation

## 2015-09-21 DIAGNOSIS — M069 Rheumatoid arthritis, unspecified: Secondary | ICD-10-CM | POA: Insufficient documentation

## 2015-09-21 DIAGNOSIS — Q8500 Neurofibromatosis, unspecified: Secondary | ICD-10-CM | POA: Diagnosis not present

## 2015-09-21 DIAGNOSIS — Z9011 Acquired absence of right breast and nipple: Secondary | ICD-10-CM | POA: Diagnosis not present

## 2015-09-21 DIAGNOSIS — C439 Malignant melanoma of skin, unspecified: Secondary | ICD-10-CM

## 2015-09-21 DIAGNOSIS — Z7982 Long term (current) use of aspirin: Secondary | ICD-10-CM | POA: Diagnosis not present

## 2015-09-21 DIAGNOSIS — D649 Anemia, unspecified: Secondary | ICD-10-CM | POA: Insufficient documentation

## 2015-09-21 DIAGNOSIS — Z85820 Personal history of malignant melanoma of skin: Secondary | ICD-10-CM

## 2015-09-21 DIAGNOSIS — Z79899 Other long term (current) drug therapy: Secondary | ICD-10-CM

## 2015-09-21 LAB — COMPREHENSIVE METABOLIC PANEL
ALBUMIN: 4.3 g/dL (ref 3.5–5.0)
ALK PHOS: 35 U/L — AB (ref 38–126)
ALT: 14 U/L (ref 14–54)
AST: 18 U/L (ref 15–41)
Anion gap: 8 (ref 5–15)
BILIRUBIN TOTAL: 0.6 mg/dL (ref 0.3–1.2)
BUN: 8 mg/dL (ref 6–20)
CALCIUM: 9.4 mg/dL (ref 8.9–10.3)
CO2: 28 mmol/L (ref 22–32)
CREATININE: 0.53 mg/dL (ref 0.44–1.00)
Chloride: 101 mmol/L (ref 101–111)
GFR calc Af Amer: >60 mL/min (ref >60)
GLUCOSE: 116 mg/dL — AB (ref 65–99)
Potassium: 3.8 mmol/L (ref 3.5–5.1)
Sodium: 137 mmol/L (ref 135–145)
TOTAL PROTEIN: 7.8 g/dL (ref 6.5–8.1)

## 2015-09-21 LAB — CBC WITH DIFFERENTIAL/PLATELET
BASOS ABS: 0 10*3/uL (ref 0–0.1)
BASOS PCT: 1 %
EOS ABS: 0 10*3/uL (ref 0–0.7)
Eosinophils Relative: 1 %
HEMATOCRIT: 35.1 % (ref 35.0–47.0)
Hemoglobin: 11.2 g/dL — ABNORMAL LOW (ref 12.0–16.0)
Lymphocytes Relative: 23 %
Lymphs Abs: 1.2 10*3/uL (ref 1.0–3.6)
MCH: 26.7 pg (ref 26.0–34.0)
MCHC: 32 g/dL (ref 32.0–36.0)
MCV: 83.5 fL (ref 80.0–100.0)
MONOS PCT: 8 %
Monocytes Absolute: 0.4 10*3/uL (ref 0.2–0.9)
NEUTROS ABS: 3.7 10*3/uL (ref 1.4–6.5)
Neutrophils Relative %: 67 %
Platelets: 196 10*3/uL (ref 150–440)
RBC: 4.2 MIL/uL (ref 3.80–5.20)
RDW: 16.2 % — ABNORMAL HIGH (ref 11.5–14.5)
WBC: 5.5 10*3/uL (ref 3.6–11.0)

## 2015-09-21 LAB — IRON AND TIBC
Iron: 38 ug/dL (ref 28–170)
Saturation Ratios: 13 % (ref 10.4–31.8)
TIBC: 291 ug/dL (ref 250–450)
UIBC: 253 ug/dL

## 2015-09-21 LAB — FERRITIN: Ferritin: 651 ng/mL — ABNORMAL HIGH (ref 11–307)

## 2015-09-21 LAB — MAGNESIUM: MAGNESIUM: 1.9 mg/dL (ref 1.7–2.4)

## 2015-09-21 MED ORDER — INFLUENZA VAC SPLIT QUAD 0.5 ML IM SUSY
0.5000 mL | PREFILLED_SYRINGE | Freq: Once | INTRAMUSCULAR | Status: AC
  Administered 2015-09-21: 0.5 mL via INTRAMUSCULAR

## 2015-09-21 NOTE — Progress Notes (Signed)
[place] @ [facility] Telephone:([phone]) [phone]  Fax:([phone]) [place]: Jun 24, 1946  MR#: [id number]  ASN#:[id number]  Patient Care Team: Glendon Axe, MD as PCP - General (Internal Medicine)  CHIEF COMPLAINT:  Chief Complaint  Patient presents with  . OTHER    Oncology History   Chief Complaint/Diagnosis:   69 year old female with newly diagnosed invasive melanoma of right breast. 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 Earlier this year this lesion started increasing in size and ulcerating. Patient had a mammogram done at [facility] facility October 2014 reported as benign. Seen by Dr. 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 BRAF V600E negative patient medical history significant for rheumatoid arthritis on methotrexate and Enbrel also significant for hypertension and neurofibromatosis. 2.sylatron had been discontinued in August of 2016     [name] [facility])    Oncology Flowsheet 05/11/2015 05/18/2015 05/25/2015 05/31/2015 06/08/2015 06/15/2015 06/22/2015  peginterferon alfa-2b (SYLATRON) [place] 3 mcg/kg 186 mcg 3 mcg/kg 3 mcg/kg 180 mcg 3 mcg/kg 3 mcg/kg    INTERVAL HISTORY:  69 year old lady came today further follow-up regarding continuation of sylatron .  No chills.  No fever.  No nausea.  No vomiting.  No diarrhea. Patient is here for continuation of interferon therapy.  Continues to feel weak tired.  Anemic patient did have colonoscopy done Patient is here for ongoing evaluation regarding melanoma.  Patient is also due for mammogram.  Has finished total more than a year of pegylated interferon therapy.  Because of progressive anemia and poor tolerance had been discontinued.  No nausea no  vomiting.  Patient is gaining strength. Marland Kitchen  REVIEW OF SYSTEMS:   GENERAL:  Feels good.  Active.  No fevers, sweats or weight loss. PERFORMANCE STATUS (ECOG):  1 HEENT:  No visual changes, runny nose, sore throat, mouth sores or tenderness. Lungs: No shortness of breath or cough.  No hemoptysis. Cardiac:  No chest pain, palpitations, orthopnea, or PND. GI:  No nausea, vomiting, diarrhea, constipation, melena or hematochezia. GU:  No urgency, frequency, dysuria, or hematuria. Musculoskeletal:  No back pain.  No joint pain.  No muscle tenderness. Extremities:  No pain or swelling. Skin:  No rashes or skin changes. Neuro:  No headache, numbness or weakness, balance or coordination issues. Endocrine:  No diabetes, thyroid issues, hot flashes or night sweats. Psych:  No mood changes, depression or anxiety. Pain:  No focal pain. Review of systems:  All other systems reviewed and found to be negative.t  As per HPI. Otherwise, a complete review of systems is negatve.    PAST SURGICAL HISTORY: Preventive Screening:  Has patient had any of the following test? Colonscopy  Mammography  Pap Smear (1)   Last Colonoscopy: due fall of 2015(1)   Last Mammography: 11/2014(1)   Last Pap Smear: status post hysterectomy 1988(1)   Smoking History: Smoking History Never Smoked.(1)  PFSH: Family History: noncontributory  Social History: negative alcohol, negative tobacco    FAMILY HISTORY No family history on file.  GYNECOLOGIC HISTORY:  No LMP recorded.     ADVANCED DIRECTIVES:    HEALTH MAINTENANCE: Social History  Substance Use Topics  . Smoking status: Never Smoker   . Smokeless tobacco: None  . Alcohol Use: None  Colonoscopy:  PAP:  Bone density:  Lipid panel:  Allergies  Allergen Reactions  . Rosuvastatin Nausea And Vomiting and Other (See Comments)  . Sulfa Antibiotics     Other reaction(s): Unknown    Current Outpatient Prescriptions  Medication Sig Dispense  Refill  . Acetaminophen 500 MG coapsule Take by mouth.    Marland Kitchen alendronate (FOSAMAX) 70 MG tablet TAKE 1 TABLET BY MOUTH EVERY 7 DAYS. TAKE WITH A FULL GLASS OF WATER AND DO NOT LIE DOWN FOR THE NEXT 30 MINUTES.    Marland Kitchen aspirin EC 81 MG tablet Take by mouth.    . Calcium-Vitamin D 600-200 MG-UNIT per tablet Take by mouth.    . Cholecalciferol 1000 UNITS tablet Take by mouth.    . ezetimibe-simvastatin (VYTORIN) 10-20 MG per tablet Take by mouth.    . ferrous sulfate 325 (65 FE) MG EC tablet Take by mouth.    . folic acid (FOLVITE) 1 MG tablet Take by mouth.    . methotrexate (RHEUMATREX) 2.5 MG tablet Take by mouth.    . mirtazapine (REMERON) 15 MG tablet TAKE (1) TABLET BY MOUTH DAILY AT BEDTIME 30 tablet 0  . Multiple Vitamin (MULTI-VITAMINS) TABS Take by mouth.    . valsartan-hydrochlorothiazide (DIOVAN-HCT) 80-12.5 MG per tablet Take by mouth.     No current facility-administered medications for this visit.   Facility-Administered Medications Ordered in Other Visits  Medication Dose Route Frequency Provider Last Rate Last Dose  . peginterferon alfa-2b (SYLATRON) injection 198 mcg  3 mcg/kg Subcutaneous Once Forest Gleason, MD      . peginterferon alfa-2b (SYLATRON) injection 198 mcg  3 mcg/kg Subcutaneous Once Forest Gleason, MD        OBJECTIVE:  Filed Vitals:   09/21/15 1200  BP: 143/80  Pulse: 76  Temp: 96.5 F (35.8 C)     Body mass index is 25.12 kg/(m^2).    ECOG FS:1 - Symptomatic but completely ambulatory  PHYSICAL EXAM: GENERAL:  Well developed, well nourished, sitting comfortably in the exam room in no acute distress. MENTAL STATUS:  Alert and oriented to person, place and time. HEAD:  .  Normocephalic, atraumatic, face symmetric, no Cushingoid features. EYES:  .  Pupils equal round and reactive to light and accomodation.  No conjunctivitis or scleral icterus. ENT:  Oropharynx clear without lesion.  Tongue normal. Mucous membranes moist.  RESPIRATORY:  Clear to auscultation  without rales, wheezes or rhonchi. CARDIOVASCULAR:  Regular rate and rhythm without murmur, rub or gallop. BREAST:  Right breast : Mastectomy.  Chest wall there is no evidence of recurrent disease.  Left breast free of masses.. Chest wall area no evidence of recurrent disease  ABDOMEN:  Soft, non-tender, with active bowel sounds, and no hepatosplenomegaly.  No masses. BACK:  No CVA tenderness.  No tenderness on percussion of the back or rib cage. SKIN:  No rashes, ulcers or lesions. EXTREMITIES: No edema, no skin discoloration or tenderness.  No palpable cords. LYMPH NODES: No palpable cervical, supraclavicular, axillary or inguinal adenopathy  NEUROLOGICAL: Unremarkable. PSYCH:  Appropriate.   LAB RESULTS:  Appointment on 09/21/2015  Component Date Value Ref Range Status  . WBC 09/21/2015 5.5  3.6 - 11.0 K/uL Final  . RBC 09/21/2015 4.20  3.80 - 5.20 MIL/uL Final  . Hemoglobin 09/21/2015 11.2* 12.0 - 16.0 g/dL Final  . HCT 09/21/2015 35.1  35.0 - 47.0 % Final  . MCV 09/21/2015 83.5  80.0 - 100.0 fL Final  . MCH 09/21/2015 26.7  26.0 -  34.0 pg Final  . MCHC 09/21/2015 32.0  32.0 - 36.0 g/dL Final  . RDW 09/21/2015 16.2* 11.5 - 14.5 % Final  . Platelets 09/21/2015 196  150 - 440 K/uL Final  . Neutrophils Relative % 09/21/2015 67   Final  . Neutro Abs 09/21/2015 3.7  1.4 - 6.5 K/uL Final  . Lymphocytes Relative 09/21/2015 23   Final  . Lymphs Abs 09/21/2015 1.2  1.0 - 3.6 K/uL Final  . Monocytes Relative 09/21/2015 8   Final  . Monocytes Absolute 09/21/2015 0.4  0.2 - 0.9 K/uL Final  . Eosinophils Relative 09/21/2015 1   Final  . Eosinophils Absolute 09/21/2015 0.0  0 - 0.7 K/uL Final  . Basophils Relative 09/21/2015 1   Final  . Basophils Absolute 09/21/2015 0.0  0 - 0.1 K/uL Final  . Sodium 09/21/2015 137  135 - 145 mmol/L Final  . Potassium 09/21/2015 3.8  3.5 - 5.1 mmol/L Final  . Chloride 09/21/2015 101  101 - 111 mmol/L Final  . CO2 09/21/2015 28  22 - 32 mmol/L Final  .  Glucose, Bld 09/21/2015 116* 65 - 99 mg/dL Final  . BUN 09/21/2015 8  6 - 20 mg/dL Final  . Creatinine, Ser 09/21/2015 0.53  0.44 - 1.00 mg/dL Final  . Calcium 09/21/2015 9.4  8.9 - 10.3 mg/dL Final  . Total Protein 09/21/2015 7.8  6.5 - 8.1 g/dL Final  . Albumin 09/21/2015 4.3  3.5 - 5.0 g/dL Final  . AST 09/21/2015 18  15 - 41 U/L Final  . ALT 09/21/2015 14  14 - 54 U/L Final  . Alkaline Phosphatase 09/21/2015 35* 38 - 126 U/L Final  . Total Bilirubin 09/21/2015 0.6  0.3 - 1.2 mg/dL Final  . GFR calc non Af Amer 09/21/2015 >60  >60 mL/min Final  . GFR calc Af Amer 09/21/2015 >60  >60 mL/min Final   Comment: (NOTE) The eGFR has been calculated using the CKD EPI equation. This calculation has not been validated in all clinical situations. eGFR's persistently <60 mL/min signify possible Chronic Kidney Disease.   . Anion gap 09/21/2015 8  5 - 15 Final  . Magnesium 09/21/2015 1.9  1.7 - 2.4 mg/dL Final       ASSESSMENT: Right breast melanoma status post resection on adjuvant sylatron   anemia  multi factorial Reassessment of anemia with iron studies prior to next appointment The patient did have colonoscopy in the past MEDICAL DECISION MAKING:  Anemia has improved. On clinical ground there is no evidence of recurrent or progressive disease. Repeat mammogram is been scheduled. Reevaluation in 4-6 month  Patient expressed understanding and was in agreement with this plan. She also understands that She can call clinic at any time with any questions, concerns, or complaints.    No matching staging information was found for the patient.  Forest Gleason, MD   09/21/2015 12:06 PM

## 2015-09-25 ENCOUNTER — Encounter: Payer: Self-pay | Admitting: Oncology

## 2016-01-18 ENCOUNTER — Other Ambulatory Visit: Payer: Medicare Other

## 2016-01-18 ENCOUNTER — Ambulatory Visit: Payer: Medicare Other | Admitting: Oncology

## 2016-01-26 ENCOUNTER — Encounter: Payer: Self-pay | Admitting: Oncology

## 2016-01-26 ENCOUNTER — Inpatient Hospital Stay (HOSPITAL_BASED_OUTPATIENT_CLINIC_OR_DEPARTMENT_OTHER): Payer: Medicare Other | Admitting: Oncology

## 2016-01-26 ENCOUNTER — Ambulatory Visit
Admission: RE | Admit: 2016-01-26 | Discharge: 2016-01-26 | Disposition: A | Discharge status: Home / Self Care | Payer: Medicare Other | Source: Ambulatory Visit | Attending: Oncology | Admitting: Oncology

## 2016-01-26 ENCOUNTER — Inpatient Hospital Stay: Payer: Medicare Other | Attending: Oncology

## 2016-01-26 VITALS — BP 135/79 | HR 79 | Temp 95.7°F | Resp 18 | Wt 164.5 lb

## 2016-01-26 DIAGNOSIS — C439 Malignant melanoma of skin, unspecified: Secondary | ICD-10-CM

## 2016-01-26 DIAGNOSIS — I517 Cardiomegaly: Secondary | ICD-10-CM | POA: Insufficient documentation

## 2016-01-26 DIAGNOSIS — I10 Essential (primary) hypertension: Secondary | ICD-10-CM | POA: Diagnosis not present

## 2016-01-26 DIAGNOSIS — R05 Cough: Secondary | ICD-10-CM | POA: Insufficient documentation

## 2016-01-26 DIAGNOSIS — D649 Anemia, unspecified: Secondary | ICD-10-CM | POA: Insufficient documentation

## 2016-01-26 DIAGNOSIS — M069 Rheumatoid arthritis, unspecified: Secondary | ICD-10-CM

## 2016-01-26 DIAGNOSIS — Z7982 Long term (current) use of aspirin: Secondary | ICD-10-CM | POA: Diagnosis not present

## 2016-01-26 DIAGNOSIS — Z90710 Acquired absence of both cervix and uterus: Secondary | ICD-10-CM

## 2016-01-26 DIAGNOSIS — Z79899 Other long term (current) drug therapy: Secondary | ICD-10-CM | POA: Insufficient documentation

## 2016-01-26 DIAGNOSIS — C4352 Malignant melanoma of skin of breast: Secondary | ICD-10-CM | POA: Insufficient documentation

## 2016-01-26 DIAGNOSIS — Z85820 Personal history of malignant melanoma of skin: Secondary | ICD-10-CM

## 2016-01-26 DIAGNOSIS — Q8500 Neurofibromatosis, unspecified: Secondary | ICD-10-CM

## 2016-01-26 DIAGNOSIS — Z9011 Acquired absence of right breast and nipple: Secondary | ICD-10-CM

## 2016-01-26 DIAGNOSIS — R918 Other nonspecific abnormal finding of lung field: Secondary | ICD-10-CM | POA: Insufficient documentation

## 2016-01-26 DIAGNOSIS — D0352 Melanoma in situ of breast (skin) (soft tissue): Secondary | ICD-10-CM | POA: Insufficient documentation

## 2016-01-26 LAB — CBC WITH DIFFERENTIAL/PLATELET
BASOS ABS: 0 10*3/uL (ref 0–0.1)
Basophils Relative: 1 %
EOS ABS: 0.1 10*3/uL (ref 0–0.7)
Eosinophils Relative: 1 %
HCT: 36.5 % (ref 35.0–47.0)
HEMOGLOBIN: 12.1 g/dL (ref 12.0–16.0)
LYMPHS ABS: 0.9 10*3/uL — AB (ref 1.0–3.6)
LYMPHS PCT: 19 %
MCH: 27.3 pg (ref 26.0–34.0)
MCHC: 33.2 g/dL (ref 32.0–36.0)
MCV: 82.1 fL (ref 80.0–100.0)
Monocytes Absolute: 0.6 10*3/uL (ref 0.2–0.9)
Monocytes Relative: 12 %
NEUTROS PCT: 67 %
Neutro Abs: 3.3 10*3/uL (ref 1.4–6.5)
Platelets: 173 10*3/uL (ref 150–440)
RBC: 4.44 MIL/uL (ref 3.80–5.20)
RDW: 15.7 % — ABNORMAL HIGH (ref 11.5–14.5)
WBC: 4.8 10*3/uL (ref 3.6–11.0)

## 2016-01-26 LAB — COMPREHENSIVE METABOLIC PANEL
ALBUMIN: 4.4 g/dL (ref 3.5–5.0)
ALK PHOS: 33 U/L — AB (ref 38–126)
ALT: 13 U/L — AB (ref 14–54)
AST: 15 U/L (ref 15–41)
Anion gap: 6 (ref 5–15)
BILIRUBIN TOTAL: 0.6 mg/dL (ref 0.3–1.2)
BUN: 15 mg/dL (ref 6–20)
CALCIUM: 8.9 mg/dL (ref 8.9–10.3)
CO2: 26 mmol/L (ref 22–32)
CREATININE: 0.62 mg/dL (ref 0.44–1.00)
Chloride: 103 mmol/L (ref 101–111)
Glucose, Bld: 117 mg/dL — ABNORMAL HIGH (ref 65–99)
Potassium: 3.7 mmol/L (ref 3.5–5.1)
SODIUM: 135 mmol/L (ref 135–145)
Total Protein: 7.9 g/dL (ref 6.5–8.1)

## 2016-01-26 NOTE — Progress Notes (Signed)
[facility] @ [facility] [phone]  [phone]     Kristina Pittman OB: 05/31/1946  MR#: [id number]  CSN#:[id number]  Patient Care Team: Jasmine Singh, MD as PCP - General (Internal Medicine)  CHIEF COMPLAINT:  Chief Complaint  Patient presents with  . [name]    Chief Complaint/Diagnosis:   70-year-old female with newly diagnosed invasive melanoma of right breast. 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 Earlier this year this lesion started increasing in size and ulcerating. Patient had a mammogram done at [facility] facility October 2014 reported as benign. Seen by 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 BRAF V600E negative patient medical history significant for rheumatoid arthritis on methotrexate and Enbrel also significant for hypertension and neurofibromatosis. 2.sylatron had been discontinued in August of 2016      Oncology Flowsheet 05/11/2015 05/18/2015 05/25/2015 05/31/2015 06/08/2015 06/15/2015 06/22/2015  peginterferon alfa-2b (SYLATRON) [place] 3 mcg/kg 186 mcg 3 mcg/kg 3 mcg/kg 180 mcg 3 mcg/kg 3 mcg/kg    INTERVAL HISTORY:  68-year-old lady came today further follow-up regarding continuation of sylatron .  No chills.  No fever.  No nausea.  No vomiting.  No diarrhea. Patient is feeling much stronger.  Appetite is improving. Had a mammogram done of the right breast which has been reported to be negative for any malignancy.  Here for further follow-up regarding melanoma metastases to the breast   .  REVIEW OF SYSTEMS:   GENERAL:  Feels good.  Active.  No fevers, sweats or weight loss. PERFORMANCE STATUS (ECOG):  1 HEENT:  No visual changes, runny nose, sore throat, mouth sores  or tenderness. Lungs: No shortness of breath or cough.  No hemoptysis. Cardiac:  No chest pain, palpitations, orthopnea, or PND. GI:  No nausea, vomiting, diarrhea, constipation, melena or hematochezia. GU:  No urgency, frequency, dysuria, or hematuria. Musculoskeletal:  No back pain.  No joint pain.  No muscle tenderness. Extremities:  No pain or swelling. Skin:  No rashes or skin changes. Neuro:  No headache, numbness or weakness, balance or coordination issues. Endocrine:  No diabetes, thyroid issues, hot flashes or night sweats. Psych:  No mood changes, depression or anxiety. Pain:  No focal pain. Review of systems:  All other systems reviewed and found to be negative.t  As per HPI. Otherwise, a complete review of systems is negatve.    PAST SURGICAL HISTORY: Preventive Screening:  Has patient had any of the following test? Colonscopy  Mammography  Pap Smear (1)   Last Colonoscopy: due fall of 2015(1)   Last Mammography: 11/2014(1)   Last Pap Smear: status post hysterectomy 1988(1)   Smoking History: Smoking History Never Smoked.(1)  PFSH: Family History: noncontributory  Social History: negative alcohol, negative tobacco    FAMILY HISTORY No family history on file.  GYNECOLOGIC HISTORY:  No LMP recorded.     ADVANCED DIRECTIVES:    HEALTH MAINTENANCE: Social History  Substance Use Topics  . Smoking status: Never Smoker   . Smokeless tobacco: None  . Alcohol Use: None     Colonoscopy:  PAP:  Bone density:  Lipid panel:  Allergies  Allergen Reactions  . Rosuvastatin Nausea And Vomiting and Other (See Comments)  . Sulfa Antibiotics     Other reaction(s):   Unknown    Current Outpatient Prescriptions  Medication Sig Dispense Refill  . alendronate (FOSAMAX) 70 MG tablet TAKE 1 TABLET BY MOUTH EVERY 7 DAYS. TAKE WITH A FULL GLASS OF WATER AND DO NOT LIE DOWN FOR THE NEXT 30 MINUTES.    . aspirin EC 81 MG tablet Take by mouth.    . Calcium-Vitamin D  600-200 MG-UNIT per tablet Take by mouth.    . ezetimibe (ZETIA) 10 MG tablet Take by mouth.    . ferrous sulfate 325 (65 FE) MG EC tablet Take by mouth.    . folic acid (FOLVITE) 1 MG tablet Take by mouth.    . methotrexate (RHEUMATREX) 2.5 MG tablet Take by mouth.    . Multiple Vitamin (MULTI-VITAMINS) TABS Take by mouth.    . simvastatin (ZOCOR) 20 MG tablet Take by mouth.    . valsartan-hydrochlorothiazide (DIOVAN-HCT) 80-12.5 MG per tablet Take by mouth.     No current facility-administered medications for this visit.   Facility-Administered Medications Ordered in Other Visits  Medication Dose Route Frequency Provider Last Rate Last Dose  . peginterferon alfa-2b (SYLATRON) injection 198 mcg  3 mcg/kg Subcutaneous Once Janak Choksi, MD      . peginterferon alfa-2b (SYLATRON) injection 198 mcg  3 mcg/kg Subcutaneous Once Janak Choksi, MD        OBJECTIVE:  Filed Vitals:   01/26/16 1030  BP: 135/79  Pulse: 79  Temp: 95.7 F (35.4 C)  Resp: 18     Body mass index is 27.98 kg/(m^2).    ECOG FS:1 - Symptomatic but completely ambulatory  PHYSICAL EXAM: GENERAL:  Well developed, well nourished, sitting comfortably in the exam room in no acute distress. MENTAL STATUS:  Alert and oriented to person, place and time. HEAD:  .  Normocephalic, atraumatic, face symmetric, no Cushingoid features. EYES:  .  Pupils equal round and reactive to light and accomodation.  No conjunctivitis or scleral icterus. ENT:  Oropharynx clear without lesion.  Tongue normal. Mucous membranes moist.  RESPIRATORY:  Clear to auscultation without rales, wheezes or rhonchi. CARDIOVASCULAR:  Regular rate and rhythm without murmur, rub or gallop. BREAST:  Right breast : Mastectomy.  Chest wall there is no evidence of recurrent disease.  Left breast free of masses.. Chest wall area no evidence of recurrent disease  ABDOMEN:  Soft, non-tender, with active bowel sounds, and no hepatosplenomegaly.  No masses. BACK:  No  CVA tenderness.  No tenderness on percussion of the back or rib cage. SKIN:  No rashes, ulcers or lesions. EXTREMITIES: No edema, no skin discoloration or tenderness.  No palpable cords. LYMPH NODES: No palpable cervical, supraclavicular, axillary or inguinal adenopathy  NEUROLOGICAL: Unremarkable. PSYCH:  Appropriate.   LAB RESULTS:  Appointment on 01/26/2016  Component Date Value Ref Range Status  . WBC 01/26/2016 4.8  3.6 - 11.0 K/uL Final  . RBC 01/26/2016 4.44  3.80 - 5.20 MIL/uL Final  . Hemoglobin 01/26/2016 12.1  12.0 - 16.0 g/dL Final  . HCT 01/26/2016 36.5  35.0 - 47.0 % Final  . MCV 01/26/2016 82.1  80.0 - 100.0 fL Final  . MCH 01/26/2016 27.3  26.0 - 34.0 pg Final  . MCHC 01/26/2016 33.2  32.0 - 36.0 g/dL Final  . RDW 01/26/2016 15.7* 11.5 - 14.5 % Final  . Platelets 01/26/2016 173  150 - 440 K/uL Final  . Neutrophils Relative % 01/26/2016 67   Final  . Neutro Abs 01/26/2016 3.3  1.4 - 6.5 K/uL Final  .   Lymphocytes Relative 01/26/2016 19   Final  . Lymphs Abs 01/26/2016 0.9* 1.0 - 3.6 K/uL Final  . Monocytes Relative 01/26/2016 12   Final  . Monocytes Absolute 01/26/2016 0.6  0.2 - 0.9 K/uL Final  . Eosinophils Relative 01/26/2016 1   Final  . Eosinophils Absolute 01/26/2016 0.1  0 - 0.7 K/uL Final  . Basophils Relative 01/26/2016 1   Final  . Basophils Absolute 01/26/2016 0.0  0 - 0.1 K/uL Final  . Sodium 01/26/2016 135  135 - 145 mmol/L Final  . Potassium 01/26/2016 3.7  3.5 - 5.1 mmol/L Final  . Chloride 01/26/2016 103  101 - 111 mmol/L Final  . CO2 01/26/2016 26  22 - 32 mmol/L Final  . Glucose, Bld 01/26/2016 117* 65 - 99 mg/dL Final  . BUN 01/26/2016 15  6 - 20 mg/dL Final  . Creatinine, Ser 01/26/2016 0.62  0.44 - 1.00 mg/dL Final  . Calcium 01/26/2016 8.9  8.9 - 10.3 mg/dL Final  . Total Protein 01/26/2016 7.9  6.5 - 8.1 g/dL Final  . Albumin 01/26/2016 4.4  3.5 - 5.0 g/dL Final  . AST 01/26/2016 15  15 - 41 U/L Final  . ALT 01/26/2016 13* 14 - 54 U/L  Final  . Alkaline Phosphatase 01/26/2016 33* 38 - 126 U/L Final  . Total Bilirubin 01/26/2016 0.6  0.3 - 1.2 mg/dL Final  . GFR calc non Af Amer 01/26/2016 >60  >60 mL/min Final  . GFR calc Af Amer 01/26/2016 >60  >60 mL/min Final   Comment: (NOTE) The eGFR has been calculated using the CKD EPI equation. This calculation has not been validated in all clinical situations. eGFR's persistently <60 mL/min signify possible Chronic Kidney Disease.   . Anion gap 01/26/2016 6  5 - 15 Final       ASSESSMENT: Right breast melanoma status post resection on adjuvant sylatron   Mammogram doneIN an outside institution has been reported to be negative (January, 2017) Chest x-ray and pattern in March of 2017) has been reviewed independently Borderline cardiomegaly without any failure or linear density at lung bases.  No evidence of metastases PET scan has been ordered prior to next appointment.  Her complete restaging. On clinical grounds there is no evidence of recurrent disease 2.  Anemia has improved Last colonoscopy was in November of 2015     Patient expressed understanding and was in agreement with this plan. She also understands that She can call clinic at any time with any questions, concerns, or complaints.    No matching staging information was found for the patient.  Forest Gleason, MD   01/26/2016 10:48 AM

## 2016-01-27 ENCOUNTER — Encounter: Payer: Self-pay | Admitting: Oncology

## 2016-04-23 ENCOUNTER — Ambulatory Visit
Admission: RE | Admit: 2016-04-23 | Discharge: 2016-04-23 | Disposition: A | Discharge status: Home / Self Care | Payer: Medicare Other | Source: Ambulatory Visit | Attending: Oncology | Admitting: Oncology

## 2016-04-23 DIAGNOSIS — C439 Malignant melanoma of skin, unspecified: Secondary | ICD-10-CM | POA: Diagnosis present

## 2016-04-23 DIAGNOSIS — E049 Nontoxic goiter, unspecified: Secondary | ICD-10-CM | POA: Diagnosis not present

## 2016-04-23 DIAGNOSIS — R911 Solitary pulmonary nodule: Secondary | ICD-10-CM | POA: Insufficient documentation

## 2016-04-23 LAB — GLUCOSE, CAPILLARY: Glucose-Capillary: 85 mg/dL (ref 65–99)

## 2016-04-23 MED ORDER — FLUDEOXYGLUCOSE F - 18 (FDG) INJECTION
12.6900 | Freq: Once | INTRAVENOUS | Status: AC | PRN
  Administered 2016-04-23: 12.69 via INTRAVENOUS

## 2016-04-29 ENCOUNTER — Other Ambulatory Visit: Payer: Self-pay | Admitting: *Deleted

## 2016-04-29 DIAGNOSIS — C439 Malignant melanoma of skin, unspecified: Secondary | ICD-10-CM

## 2016-04-30 ENCOUNTER — Inpatient Hospital Stay: Payer: Medicare Other | Attending: Internal Medicine

## 2016-04-30 ENCOUNTER — Encounter: Payer: Self-pay | Admitting: Internal Medicine

## 2016-04-30 ENCOUNTER — Inpatient Hospital Stay (HOSPITAL_BASED_OUTPATIENT_CLINIC_OR_DEPARTMENT_OTHER): Payer: Medicare Other | Admitting: Internal Medicine

## 2016-04-30 VITALS — BP 148/86 | HR 78 | Temp 97.3°F | Resp 18 | Wt 170.0 lb

## 2016-04-30 DIAGNOSIS — Z79899 Other long term (current) drug therapy: Secondary | ICD-10-CM | POA: Insufficient documentation

## 2016-04-30 DIAGNOSIS — Z85820 Personal history of malignant melanoma of skin: Secondary | ICD-10-CM

## 2016-04-30 DIAGNOSIS — Z7982 Long term (current) use of aspirin: Secondary | ICD-10-CM | POA: Insufficient documentation

## 2016-04-30 DIAGNOSIS — C439 Malignant melanoma of skin, unspecified: Secondary | ICD-10-CM

## 2016-04-30 DIAGNOSIS — M069 Rheumatoid arthritis, unspecified: Secondary | ICD-10-CM

## 2016-04-30 DIAGNOSIS — D509 Iron deficiency anemia, unspecified: Secondary | ICD-10-CM

## 2016-04-30 DIAGNOSIS — Q8500 Neurofibromatosis, unspecified: Secondary | ICD-10-CM

## 2016-04-30 DIAGNOSIS — I10 Essential (primary) hypertension: Secondary | ICD-10-CM

## 2016-04-30 DIAGNOSIS — R911 Solitary pulmonary nodule: Secondary | ICD-10-CM | POA: Insufficient documentation

## 2016-04-30 DIAGNOSIS — Z9011 Acquired absence of right breast and nipple: Secondary | ICD-10-CM

## 2016-04-30 DIAGNOSIS — Z923 Personal history of irradiation: Secondary | ICD-10-CM | POA: Diagnosis not present

## 2016-04-30 DIAGNOSIS — E7800 Pure hypercholesterolemia, unspecified: Secondary | ICD-10-CM

## 2016-04-30 LAB — COMPREHENSIVE METABOLIC PANEL
ALBUMIN: 4.2 g/dL (ref 3.5–5.0)
ALT: 15 U/L (ref 14–54)
AST: 15 U/L (ref 15–41)
Alkaline Phosphatase: 39 U/L (ref 38–126)
Anion gap: 6 (ref 5–15)
BUN: 17 mg/dL (ref 6–20)
CHLORIDE: 104 mmol/L (ref 101–111)
CO2: 28 mmol/L (ref 22–32)
CREATININE: 0.67 mg/dL (ref 0.44–1.00)
Calcium: 8.9 mg/dL (ref 8.9–10.3)
GFR calc Af Amer: >60 mL/min (ref >60)
GLUCOSE: 115 mg/dL — AB (ref 65–99)
POTASSIUM: 3.8 mmol/L (ref 3.5–5.1)
Sodium: 138 mmol/L (ref 135–145)
Total Bilirubin: 0.5 mg/dL (ref 0.3–1.2)
Total Protein: 7.5 g/dL (ref 6.5–8.1)

## 2016-04-30 NOTE — Assessment & Plan Note (Addendum)
Melanoma/ right breast- status post surgery followed by radiation followed by adjuvant pegylated interferon for one year [finished August 2016]. Follow-up PET scan does not show any evidence of recurrence.   # Right upper lobe lung nodule 3 mm- likely benign [stable from 2015]   # patient follow-up with me in approximately 6 months CBC CMP and LDH.    I reviewed the images myself and with the patient in detail.

## 2016-04-30 NOTE — Progress Notes (Signed)
[facility] OFFICE PROGRESS NOTE  Patient Care Team: Glendon Axe, MD as PCP - General (Internal Medicine)  No matching staging information was found for the patient.   Oncology History   # June 2015- STAGE II- RIGHT BREAST SKIN MELANOMA- [ Dr. 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  # MOLECULAR-  BRAF V600E negative;      [name] ([place])     INTERVAL HISTORY:  Kristina Pittman 70 y.o.  female pleasant patient above history of Melanoma is here for follow-up.  Patient denies any new lumps or bumps. Appetite is good. She is gaining weight. She is not fatigued. No nausea no vomiting headaches. No shortness of breath or cough.    REVIEW OF SYSTEMS:  A complete 10 point review of system is done which is negative except mentioned above/history of present illness.   PAST MEDICAL HISTORY :  Past Medical History  Diagnosis Date  . Skin cancer     melanoma  . Hypertension   . Depression   . Osteoporosis   . Arthritis   . Iron deficiency anemia   . Neurofibromatosis ([place])   . Hyperlipidemia   . Iron deficiency anemia   . Hypercholesterolemia     PAST SURGICAL HISTORY :   Past Surgical History  Procedure Laterality Date  . Abdominal hysterectomy    . Excision  right breast mass      FAMILY HISTORY :  No family history on file.  SOCIAL HISTORY:   Social History  Substance Use Topics  . Smoking status: Never Smoker   . Smokeless tobacco: Not on file  . Alcohol Use: Not on file    ALLERGIES:  is allergic to rosuvastatin and sulfa  antibiotics.  MEDICATIONS:  Current Outpatient Prescriptions  Medication Sig Dispense Refill  . alendronate (FOSAMAX) 70 MG tablet TAKE 1 TABLET BY MOUTH EVERY 7 DAYS. TAKE WITH A FULL GLASS OF WATER AND DO NOT LIE DOWN FOR THE NEXT 30 MINUTES.    Marland Kitchen aspirin EC 81 MG tablet Take by mouth.    . Calcium-Vitamin D 600-200 MG-UNIT per tablet Take by mouth.    . Cholecalciferol 1000 units tablet Take 1,000 Units by mouth daily.    Marland Kitchen ezetimibe (ZETIA) 10 MG tablet Take by mouth.    . ferrous sulfate 325 (65 FE) MG EC tablet Take by mouth.    . folic acid (FOLVITE) 1 MG tablet Take by mouth.    . methotrexate (RHEUMATREX) 2.5 MG tablet Take by mouth.    . Multiple Vitamin (MULTI-VITAMINS) TABS Take by mouth.    . simvastatin (ZOCOR) 20 MG tablet Take by mouth.    . valsartan-hydrochlorothiazide (DIOVAN-HCT) 80-12.5 MG per tablet Take by mouth.     No current facility-administered medications for this visit.   Facility-Administered Medications Ordered in Other Visits  Medication Dose Route Frequency Provider Last Rate Last Dose  . peginterferon alfa-2b (SYLATRON) injection 198 mcg  3 mcg/kg Subcutaneous Once Forest Gleason, MD      . peginterferon alfa-2b (SYLATRON) injection 198 mcg  3 mcg/kg Subcutaneous Once [employer],  MD        PHYSICAL EXAMINATION: ECOG PERFORMANCE STATUS: 0 - Asymptomatic  BP 148/86 mmHg  Pulse 78  Temp(Src) 97.3 F (36.3 C) (Tympanic)  Resp 18  Wt 169 lb 15.6 oz (77.1 kg)  Filed Weights   04/30/16 1049  Weight: 169 lb 15.6 oz (77.1 kg)    GENERAL: Well-nourished well-developed; Alert, no distress and comfortable.  Alone,  EYES: no pallor or icterus OROPHARYNX: no thrush or ulceration; good dentition  NECK: supple, no masses felt LYMPH:  no palpable lymphadenopathy in the cervical, axillary or inguinal regions LUNGS: clear to auscultation and  No wheeze or crackles HEART/CVS: regular rate & rhythm and no murmurs; No lower extremity edema ABDOMEN:abdomen  soft, non-tender and normal bowel sounds Musculoskeletal:no cyanosis of digits and no clubbing  PSYCH: alert & oriented x 3 with fluent speech NEURO: no focal motor/sensory deficits SKIN:  Multiple nodules suggestive of neurofibromatosis noted; none new.  Right mastectomy site noted; pigmentation secondary to radiation.  Otherwise no nodules noted   LABORATORY DATA:  I have reviewed the data as listed    Component Value Date/Time   NA 138 04/30/2016 1022   NA 141 03/02/2015 0952   K 3.8 04/30/2016 1022   K 3.9 03/02/2015 0952   CL 104 04/30/2016 1022   CL 106 03/02/2015 0952   CO2 28 04/30/2016 1022   CO2 28 03/02/2015 0952   GLUCOSE 115* 04/30/2016 1022   GLUCOSE 97 03/02/2015 0952   BUN 17 04/30/2016 1022   BUN 10 03/02/2015 0952   CREATININE 0.67 04/30/2016 1022   CREATININE 0.57 03/02/2015 0952   CALCIUM 8.9 04/30/2016 1022   CALCIUM 9.1 03/02/2015 0952   PROT 7.5 04/30/2016 1022   PROT 7.4 03/02/2015 0952   ALBUMIN 4.2 04/30/2016 1022   ALBUMIN 4.2 03/02/2015 0952   AST 15 04/30/2016 1022   AST 24 03/02/2015 0952   ALT 15 04/30/2016 1022   ALT 21 03/02/2015 0952   ALKPHOS 39 04/30/2016 1022   ALKPHOS 41 03/02/2015 0952   BILITOT 0.5 04/30/2016 1022   BILITOT 0.8 03/02/2015 0952   GFRNONAA >60 04/30/2016 1022   GFRNONAA >60 03/02/2015 0952   GFRNONAA >60 11/18/2014 0858   GFRAA >60 04/30/2016 1022   GFRAA >60 03/02/2015 0952   GFRAA >60 11/18/2014 0858    No results found for: SPEP, UPEP  Lab Results  Component Value Date   WBC 4.8 01/26/2016   NEUTROABS 3.3 01/26/2016   HGB 12.1 01/26/2016   HCT 36.5 01/26/2016   MCV 82.1 01/26/2016   PLT 173 01/26/2016      Chemistry      Component Value Date/Time   NA 138 04/30/2016 1022   NA 141 03/02/2015 0952   K 3.8 04/30/2016 1022   K 3.9 03/02/2015 0952   CL 104 04/30/2016 1022   CL 106 03/02/2015 0952   CO2 28 04/30/2016 1022   CO2 28 03/02/2015 0952   BUN 17 04/30/2016 1022   BUN 10 03/02/2015 0952    CREATININE 0.67 04/30/2016 1022   CREATININE 0.57 03/02/2015 0952      Component Value Date/Time   CALCIUM 8.9 04/30/2016 1022   CALCIUM 9.1 03/02/2015 0952   ALKPHOS 39 04/30/2016 1022   ALKPHOS 41 03/02/2015 0952   AST 15 04/30/2016 1022   AST 24 03/02/2015 0952   ALT 15 04/30/2016 1022   ALT 21 03/02/2015 0952   BILITOT 0.5 04/30/2016 1022   BILITOT 0.8 03/02/2015 1308  RADIOGRAPHIC STUDIES: I have personally reviewed the radiological images as listed and agreed with the findings in the report. No results found.   ASSESSMENT & PLAN:  [name] ([place]) Melanoma/ right breast- status post surgery followed by radiation followed by adjuvant pegylated interferon for one year [finished August 2016]. Follow-up PET scan does not show any evidence of recurrence.   # Right upper lobe lung nodule 3 mm- likely benign [stable from 2015]   # patient follow-up with me in approximately 6 months CBC CMP and LDH.    I reviewed the images myself and with the patient in detail.   # 25 minutes face-to-face with the patient discussing the above plan of care; more than 50% of time spent on prognosis/ natural history; counseling and coordination.   Orders Placed This Encounter  Procedures  . CBC with Differential    Standing Status: Future     Number of Occurrences:      Standing Expiration Date: 04/30/2017  . Comprehensive metabolic panel    Standing Status: Future     Number of Occurrences:      Standing Expiration Date: 04/30/2017    Order Specific Question:  Has the patient fasted?    Answer:  No  . Lactate dehydrogenase    Standing Status: Future     Number of Occurrences:      Standing Expiration Date: 04/30/2017   All questions were answered. The patient knows to call the clinic with any problems, questions or concerns.      Cammie Sickle, MD 04/30/2016 6:23 PM

## 2016-06-05 ENCOUNTER — Ambulatory Visit
Admission: RE | Admit: 2016-06-05 | Discharge: 2016-06-05 | Disposition: A | Discharge status: Home / Self Care | Payer: Medicare Other | Source: Ambulatory Visit | Attending: Radiation Oncology | Admitting: Radiation Oncology

## 2016-06-05 ENCOUNTER — Encounter: Payer: Self-pay | Admitting: Radiation Oncology

## 2016-06-05 VITALS — BP 133/83 | HR 82 | Temp 97.3°F | Resp 20 | Wt 170.9 lb

## 2016-06-05 DIAGNOSIS — Z923 Personal history of irradiation: Secondary | ICD-10-CM | POA: Insufficient documentation

## 2016-06-05 DIAGNOSIS — C439 Malignant melanoma of skin, unspecified: Secondary | ICD-10-CM

## 2016-06-05 DIAGNOSIS — Z853 Personal history of malignant neoplasm of breast: Secondary | ICD-10-CM | POA: Insufficient documentation

## 2016-06-05 NOTE — Progress Notes (Signed)
Radiation Oncology Follow up Note  Name: Kristina Pittman   Date:   06/05/2016 MRN:  [id number] DOB: 16-Sep-1946    This 70 y.o. female presents to the clinic today for 2 year follow-up for melanoma of the right breast status post right modified radical mastectomy and postop radiation therapy.  REFERRING PROVIDER: Glendon Axe, MD  HPI: Patient is a 70 year old female now out 2 years having completed right chest wall peripheral lymphatic radiation therapy status post right modified radical mastectomy for a stage for Clark's level V malignant melanoma of the right breast. She had extensive lymph node dissection we only treated her chest wall. She is seen today in routine follow-up and is doing well. She specifically denies any new mass or nodularity in the right chest wall pain cough. She had a PET scan in June 2017 showing no hypermetabolic activity throughout her body.  COMPLICATIONS OF TREATMENT: none  FOLLOW UP COMPLIANCE: keeps appointments   PHYSICAL EXAM:  BP 133/83   Pulse 82   Temp 97.3 F (36.3 C)   Resp 20   Wt 170 lb 13.7 oz (77.5 kg)   BMI 29.05 kg/m  She status post right modified radical mastectomy chest walls clear. Left breast is free of dominant mass or nodularity in 2 positions examined. No axillary or supraclavicular adenopathy is appreciated. Well-developed well-nourished patient in NAD. HEENT reveals PERLA, EOMI, discs not visualized.  Oral cavity is clear. No oral mucosal lesions are identified. Neck is clear without evidence of cervical or supraclavicular adenopathy. Lungs are clear to A&P. Cardiac examination is essentially unremarkable with regular rate and rhythm without murmur rub or thrill. Abdomen is benign with no organomegaly or masses noted. Motor sensory and DTR levels are equal and symmetric in the upper and lower extremities. Cranial nerves II through XII are grossly intact. Proprioception is intact. No peripheral adenopathy or edema is identified. No motor  or sensory levels are noted. Crude visual fields are within normal range.  RADIOLOGY RESULTS: PET CT scan performed in June 2017 showed no evidence of disease.  PLAN: Present time she continues to do well 2 years out with no evidence of disease. I'm please were overall progress. I've asked to see her back in 1 year for follow-up. Patient knows to call sooner with any concerns.  I would like to take this opportunity to thank you for allowing me to participate in the care of your patient.Armstead Peaks., MD

## 2016-06-05 NOTE — Addendum Note (Signed)
Encounter addended by: Timofey Carandang, MD on: 06/05/2016 11:53 AM<BR>    Actions taken: LOS modified, Follow-up modified

## 2016-08-30 ENCOUNTER — Encounter: Payer: Self-pay | Admitting: *Deleted

## 2016-10-29 ENCOUNTER — Other Ambulatory Visit: Payer: Medicare Other

## 2016-10-29 ENCOUNTER — Ambulatory Visit: Payer: Medicare Other | Admitting: Internal Medicine

## 2016-11-05 ENCOUNTER — Inpatient Hospital Stay: Payer: Medicare Other | Attending: Internal Medicine | Admitting: Internal Medicine

## 2016-11-05 ENCOUNTER — Inpatient Hospital Stay: Payer: Medicare Other

## 2016-11-05 VITALS — BP 151/83 | HR 75 | Temp 97.1°F | Wt 175.9 lb

## 2016-11-05 DIAGNOSIS — Q8500 Neurofibromatosis, unspecified: Secondary | ICD-10-CM | POA: Diagnosis not present

## 2016-11-05 DIAGNOSIS — Z9011 Acquired absence of right breast and nipple: Secondary | ICD-10-CM | POA: Insufficient documentation

## 2016-11-05 DIAGNOSIS — I10 Essential (primary) hypertension: Secondary | ICD-10-CM | POA: Diagnosis not present

## 2016-11-05 DIAGNOSIS — M810 Age-related osteoporosis without current pathological fracture: Secondary | ICD-10-CM | POA: Insufficient documentation

## 2016-11-05 DIAGNOSIS — R911 Solitary pulmonary nodule: Secondary | ICD-10-CM

## 2016-11-05 DIAGNOSIS — Z923 Personal history of irradiation: Secondary | ICD-10-CM | POA: Diagnosis not present

## 2016-11-05 DIAGNOSIS — E785 Hyperlipidemia, unspecified: Secondary | ICD-10-CM | POA: Diagnosis not present

## 2016-11-05 DIAGNOSIS — Z85820 Personal history of malignant melanoma of skin: Secondary | ICD-10-CM | POA: Diagnosis not present

## 2016-11-05 DIAGNOSIS — Z1231 Encounter for screening mammogram for malignant neoplasm of breast: Secondary | ICD-10-CM

## 2016-11-05 DIAGNOSIS — Z79899 Other long term (current) drug therapy: Secondary | ICD-10-CM | POA: Diagnosis not present

## 2016-11-05 DIAGNOSIS — M069 Rheumatoid arthritis, unspecified: Secondary | ICD-10-CM | POA: Diagnosis not present

## 2016-11-05 DIAGNOSIS — C439 Malignant melanoma of skin, unspecified: Secondary | ICD-10-CM

## 2016-11-05 DIAGNOSIS — Z7982 Long term (current) use of aspirin: Secondary | ICD-10-CM | POA: Diagnosis not present

## 2016-11-05 LAB — CBC WITH DIFFERENTIAL/PLATELET
BASOS ABS: 0.1 10*3/uL (ref 0–0.1)
BASOS PCT: 1 %
Eosinophils Absolute: 0.1 10*3/uL (ref 0–0.7)
Eosinophils Relative: 2 %
HEMATOCRIT: 39.7 % (ref 35.0–47.0)
HEMOGLOBIN: 12.6 g/dL (ref 12.0–16.0)
Lymphocytes Relative: 16 %
Lymphs Abs: 1.1 10*3/uL (ref 1.0–3.6)
MCH: 26.7 pg (ref 26.0–34.0)
MCHC: 31.8 g/dL — ABNORMAL LOW (ref 32.0–36.0)
MCV: 84 fL (ref 80.0–100.0)
MONOS PCT: 11 %
Monocytes Absolute: 0.7 10*3/uL (ref 0.2–0.9)
NEUTROS ABS: 4.6 10*3/uL (ref 1.4–6.5)
NEUTROS PCT: 70 %
Platelets: 191 10*3/uL (ref 150–440)
RBC: 4.73 MIL/uL (ref 3.80–5.20)
RDW: 16.5 % — ABNORMAL HIGH (ref 11.5–14.5)
WBC: 6.5 10*3/uL (ref 3.6–11.0)

## 2016-11-05 LAB — COMPREHENSIVE METABOLIC PANEL
ALK PHOS: 39 U/L (ref 38–126)
ALT: 15 U/L (ref 14–54)
AST: 17 U/L (ref 15–41)
Albumin: 4.5 g/dL (ref 3.5–5.0)
Anion gap: 8 (ref 5–15)
BILIRUBIN TOTAL: 0.7 mg/dL (ref 0.3–1.2)
BUN: 18 mg/dL (ref 6–20)
CALCIUM: 9.5 mg/dL (ref 8.9–10.3)
CO2: 28 mmol/L (ref 22–32)
Chloride: 102 mmol/L (ref 101–111)
Creatinine, Ser: 0.79 mg/dL (ref 0.44–1.00)
GFR calc Af Amer: >60 mL/min (ref >60)
GFR calc non Af Amer: >60 mL/min (ref >60)
Glucose, Bld: 126 mg/dL — ABNORMAL HIGH (ref 65–99)
Potassium: 3.8 mmol/L (ref 3.5–5.1)
Sodium: 138 mmol/L (ref 135–145)
TOTAL PROTEIN: 7.7 g/dL (ref 6.5–8.1)

## 2016-11-05 LAB — LACTATE DEHYDROGENASE: LDH: 124 U/L (ref 98–192)

## 2016-11-05 NOTE — Progress Notes (Signed)
Patient here today for follow up.  Patient needs mammogram order, last mammo November 27, 2015

## 2016-11-05 NOTE — Progress Notes (Signed)
[facility] OFFICE PROGRESS NOTE  Patient Care Team: Glendon Axe, MD as PCP - General (Internal Medicine)  No matching staging information was found for the patient.   Oncology History   # June 2015- STAGE II- RIGHT BREAST SKIN MELANOMA- [ Dr. 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    # Rheumatoid arthritis on methotrexate and Enbrel; hypertension and NEUROFIBROMATOSIS  # MOLECULAR-  BRAF V600E negative     [name] ([place])     INTERVAL HISTORY:  Kristina Pittman 71 y.o.  female pleasant patient above history of Melanoma is here for follow-up.   Patient denies any headaches. Patient denies any new lumps or bumps. Appetite is good. She is gaining weight. She is not fatigued. No nausea no vomiting headaches. No shortness of breath or cough.    REVIEW OF SYSTEMS:  A complete 10 point review of system is done which is negative except mentioned above/history of present illness.   PAST MEDICAL HISTORY :  Past Medical History:  Diagnosis Date  . Arthritis   . Depression   . Hypercholesterolemia   . Hyperlipidemia   . Hypertension   . Iron deficiency anemia   . Iron deficiency anemia   . Neurofibromatosis ([place])   . Osteoporosis   . Skin cancer    melanoma    PAST SURGICAL HISTORY :   Past Surgical History:  Procedure Laterality Date  . ABDOMINAL HYSTERECTOMY    . Excision  right breast mass      FAMILY HISTORY :  No family history on file.  SOCIAL HISTORY:   Social History  Substance Use Topics  . Smoking status: Never Smoker  . Smokeless tobacco: Not on file  . Alcohol use Not on file    ALLERGIES:  is  allergic to rosuvastatin and sulfa antibiotics.  MEDICATIONS:  Current Outpatient Prescriptions  Medication Sig Dispense Refill  . alendronate (FOSAMAX) 70 MG tablet TAKE 1 TABLET BY MOUTH EVERY 7 DAYS. TAKE WITH A FULL GLASS OF WATER AND DO NOT LIE DOWN FOR THE NEXT 30 MINUTES.    Marland Kitchen aspirin EC 81 MG tablet Take by mouth.    . Calcium-Vitamin D 600-200 MG-UNIT per tablet Take by mouth.    . Cholecalciferol 1000 units tablet Take 1,000 Units by mouth daily.    Marland Kitchen ezetimibe (ZETIA) 10 MG tablet Take 10 mg by mouth daily.     . ferrous sulfate 325 (65 FE) MG EC tablet Take 325 mg by mouth daily with breakfast.     . folic acid (FOLVITE) 1 MG tablet Take 1 mg by mouth daily.     . methotrexate (RHEUMATREX) 2.5 MG tablet Take by mouth.    . Multiple Vitamin (MULTI-VITAMINS) TABS Take by mouth.    . simvastatin (ZOCOR) 20 MG tablet Take 20 mg by mouth daily.     . valsartan-hydrochlorothiazide (DIOVAN-HCT) 80-12.5 MG per tablet Take 1 tablet by mouth daily.      No current facility-administered medications for this visit.    Facility-Administered Medications Ordered in Other Visits  Medication Dose Route Frequency Provider Last Rate Last Dose  . peginterferon alfa-2b (SYLATRON)  injection 198 mcg  3 mcg/kg Subcutaneous Once Forest Gleason, MD      . peginterferon alfa-2b (SYLATRON) injection 198 mcg  3 mcg/kg Subcutaneous Once Forest Gleason, MD        PHYSICAL EXAMINATION: ECOG PERFORMANCE STATUS: 0 - Asymptomatic  BP (!) 151/83 (BP Location: Left Arm, Patient Position: Sitting)   Pulse 75   Temp 97.1 F (36.2 C) (Tympanic)   Wt 175 lb 14.8 oz (79.8 kg)   BMI 29.92 kg/m   Filed Weights   11/05/16 1112  Weight: 175 lb 14.8 oz (79.8 kg)    GENERAL: Well-nourished well-developed; Alert, no distress and comfortable.  Alone,  EYES: no pallor or icterus OROPHARYNX: no thrush or ulceration; good dentition  NECK: supple, no masses felt LYMPH:  no palpable lymphadenopathy in the cervical,  axillary or inguinal regions LUNGS: clear to auscultation and  No wheeze or crackles HEART/CVS: regular rate & rhythm and no murmurs; No lower extremity edema ABDOMEN:abdomen soft, non-tender and normal bowel sounds Musculoskeletal:no cyanosis of digits and no clubbing  PSYCH: alert & oriented x 3 with fluent speech NEURO: no focal motor/sensory deficits SKIN:  Multiple nodules suggestive of neurofibromatosis noted; none new.  Right mastectomy site noted; pigmentation secondary to radiation.  Otherwise no nodules noted   LABORATORY DATA:  I have reviewed the data as listed    Component Value Date/Time   NA 138 11/05/2016 1017   NA 141 03/02/2015 0952   K 3.8 11/05/2016 1017   K 3.9 03/02/2015 0952   CL 102 11/05/2016 1017   CL 106 03/02/2015 0952   CO2 28 11/05/2016 1017   CO2 28 03/02/2015 0952   GLUCOSE 126 (H) 11/05/2016 1017   GLUCOSE 97 03/02/2015 0952   BUN 18 11/05/2016 1017   BUN 10 03/02/2015 0952   CREATININE 0.79 11/05/2016 1017   CREATININE 0.57 03/02/2015 0952   CALCIUM 9.5 11/05/2016 1017   CALCIUM 9.1 03/02/2015 0952   PROT 7.7 11/05/2016 1017   PROT 7.4 03/02/2015 0952   ALBUMIN 4.5 11/05/2016 1017   ALBUMIN 4.2 03/02/2015 0952   AST 17 11/05/2016 1017   AST 24 03/02/2015 0952   ALT 15 11/05/2016 1017   ALT 21 03/02/2015 0952   ALKPHOS 39 11/05/2016 1017   ALKPHOS 41 03/02/2015 0952   BILITOT 0.7 11/05/2016 1017   BILITOT 0.8 03/02/2015 0952   GFRNONAA >60 11/05/2016 1017   GFRNONAA >60 03/02/2015 0952   GFRAA >60 11/05/2016 1017   GFRAA >60 03/02/2015 0952    No results found for: SPEP, UPEP  Lab Results  Component Value Date   WBC 6.5 11/05/2016   NEUTROABS 4.6 11/05/2016   HGB 12.6 11/05/2016   HCT 39.7 11/05/2016   MCV 84.0 11/05/2016   PLT 191 11/05/2016      Chemistry      Component Value Date/Time   NA 138 11/05/2016 1017   NA 141 03/02/2015 0952   K 3.8 11/05/2016 1017   K 3.9 03/02/2015 0952   CL 102 11/05/2016 1017   CL 106  03/02/2015 0952   CO2 28 11/05/2016 1017   CO2 28 03/02/2015 0952   BUN 18 11/05/2016 1017   BUN 10 03/02/2015 0952   CREATININE 0.79 11/05/2016 1017   CREATININE 0.57 03/02/2015 0952      Component Value Date/Time   CALCIUM 9.5 11/05/2016 1017   CALCIUM 9.1 03/02/2015 0952   ALKPHOS 39 11/05/2016 1017   ALKPHOS 41 03/02/2015 0952   AST 17 11/05/2016  1017   AST 24 03/02/2015 0952   ALT 15 11/05/2016 1017   ALT 21 03/02/2015 0952   BILITOT 0.7 11/05/2016 1017   BILITOT 0.8 03/02/2015 0952       RADIOGRAPHIC STUDIES: I have personally reviewed the radiological images as listed and agreed with the findings in the report. No results found.   ASSESSMENT & PLAN:  [name] ([place]) Melanoma/ right breast- status post mastectomt followed by radiation followed by adjuvant pegylated interferon for one year [finished August 2016]. Follow-up PET scan June 2017- does not show any evidence of recurrence.   # clinically no evidence of disease. Labs - Normal.   # Right upper lobe lung nodule 3 mm- likely benign [stable from 2015]   # Screening mammogram- left sided ordered for jan 2018.   # patient follow-up with me in approximately 6 months CBC CMP and LDH; PET scan prior.     Orders Placed This Encounter  Procedures  . NM PET Image Restage (PS) Whole Body    Standing Status:   Future    Standing Expiration Date:   11/05/2017    Order Specific Question:   Reason for Exam (SYMPTOM  OR DIAGNOSIS REQUIRED)    Answer:   melanoma of right breast    Order Specific Question:   Preferred imaging location?    Answer:   [facility]    Order Specific Question:   If indicated for the ordered procedure, I authorize the administration of a radiopharmaceutical per Radiology protocol    Answer:   Yes  . MM Digital Screening Unilat L    Standing Status:   Future    Standing Expiration Date:   11/05/2017    Order Specific Question:   Reason for Exam (SYMPTOM  OR DIAGNOSIS REQUIRED)     Answer:   screening mammogram    Order Specific Question:   Preferred imaging location?    Answer:   External    Comments:   [facility]   . US Breast Complete Uni Left Inc Axilla    Standing Status:   Future    Standing Expiration Date:   01/03/2018    Order Specific Question:   Reason for Exam (SYMPTOM  OR DIAGNOSIS REQUIRED)    Answer:   screening mammogram    Order Specific Question:   Preferred imaging location?    Answer:   External  . CBC with Differential/Platelet    Standing Status:   Future    Standing Expiration Date:   11/05/2017  . Comprehensive metabolic panel    Standing Status:   Future    Standing Expiration Date:   11/05/2017  . Lactate dehydrogenase    Standing Status:   Future    Standing Expiration Date:   11/05/2017   All questions were answered. The patient knows to call the clinic with any problems, questions or concerns.      Cammie Sickle, MD 11/05/2016 12:18 PM

## 2016-11-05 NOTE — Assessment & Plan Note (Addendum)
Melanoma/ right breast- status post mastectomt followed by radiation followed by adjuvant pegylated interferon for one year [finished August 2016]. Follow-up PET scan June 2017- does not show any evidence of recurrence.   # clinically no evidence of disease. Labs - Normal.   # Right upper lobe lung nodule 3 mm- likely benign [stable from 2015]   # Screening mammogram- left sided ordered for jan 2018.   # patient follow-up with me in approximately 6 months CBC CMP and LDH; PET scan prior.

## 2016-12-20 ENCOUNTER — Ambulatory Visit: Payer: Self-pay | Admitting: Radiation Oncology

## 2016-12-30 ENCOUNTER — Encounter: Payer: Self-pay | Admitting: Radiation Oncology

## 2016-12-30 ENCOUNTER — Ambulatory Visit
Admission: RE | Admit: 2016-12-30 | Discharge: 2016-12-30 | Disposition: A | Discharge status: Home / Self Care | Payer: Medicare Other | Source: Ambulatory Visit | Attending: Radiation Oncology | Admitting: Radiation Oncology

## 2016-12-30 VITALS — BP 137/73 | HR 75 | Temp 98.7°F | Resp 18 | Wt 180.2 lb

## 2016-12-30 DIAGNOSIS — Z85820 Personal history of malignant melanoma of skin: Secondary | ICD-10-CM | POA: Insufficient documentation

## 2016-12-30 DIAGNOSIS — Z9011 Acquired absence of right breast and nipple: Secondary | ICD-10-CM | POA: Insufficient documentation

## 2016-12-30 DIAGNOSIS — Z923 Personal history of irradiation: Secondary | ICD-10-CM | POA: Diagnosis not present

## 2016-12-30 DIAGNOSIS — R918 Other nonspecific abnormal finding of lung field: Secondary | ICD-10-CM | POA: Diagnosis not present

## 2016-12-30 DIAGNOSIS — C439 Malignant melanoma of skin, unspecified: Secondary | ICD-10-CM

## 2016-12-30 NOTE — Progress Notes (Signed)
Radiation Oncology Follow up Note  Name: Kristina Pittman   Date:   12/30/2016 MRN:  [id number] DOB: 28-Jun-1946    This 71 y.o. female presents to the clinic today for 2-1/2 year follow-up status post right chest wall peripheral lymphatic radiation for malignant melanoma of the right breast status post mastectomy.  REFERRING PROVIDER: Glendon Axe, MD  HPI: Patient is a 71 year old female now out 2 and half years status post radiation therapy to her right chest wall peripheral lymphatics status post mastectomy for malignant melanoma. She is seen today in routine follow-up and is doing well. PET scan in June 2017 shows no evidence of disease. She has mammogram back in January 2018 which was fine of the left breast. She has a 3 mm right lung lung upper lobe nodules which is being followed. She specifically denies any chest wall nodularity cough bone pain or any swelling of her right upper extremity..  COMPLICATIONS OF TREATMENT: none  FOLLOW UP COMPLIANCE: keeps appointments   PHYSICAL EXAM:  BP 137/73   Pulse 75   Temp 98.7 F (37.1 C)   Resp 18   Wt 180 lb 3.6 oz (81.8 kg)   BMI 30.65 kg/m  Patient is status post right modified radical mastectomy chest walls clear without evidence of mass or nodularity. Left breast is free of dominant mass or nodularity in 2 positions examined. No axillary or supraclavicular adenopathy is appreciated. Well-developed well-nourished patient in NAD. HEENT reveals PERLA, EOMI, discs not visualized.  Oral cavity is clear. No oral mucosal lesions are identified. Neck is clear without evidence of cervical or supraclavicular adenopathy. Lungs are clear to A&P. Cardiac examination is essentially unremarkable with regular rate and rhythm without murmur rub or thrill. Abdomen is benign with no organomegaly or masses noted. Motor sensory and DTR levels are equal and symmetric in the upper and lower extremities. Cranial nerves II through XII are grossly intact.  Proprioception is intact. No peripheral adenopathy or edema is identified. No motor or sensory levels are noted. Crude visual fields are within normal range.  RADIOLOGY RESULTS: Mammograms the left breast from January is reviewed showing no evidence of disease. Previous PET scan is reviewed from June 2017 also showing no evidence of disease  PLAN: Present time patient is doing well with no evidence of disease. I'm please were overall progress. I've asked to see her back in 1 year for follow-up. Patient continues close follow-up care with medical oncology. She knows to call with any concerns.  I would like to take this opportunity to thank you for allowing me to participate in the care of your patient.Armstead Peaks., MD

## 2017-01-01 ENCOUNTER — Ambulatory Visit: Payer: Medicare Other | Admitting: Radiation Oncology

## 2017-05-15 ENCOUNTER — Encounter
Admission: RE | Admit: 2017-05-15 | Discharge: 2017-05-15 | Disposition: A | Discharge status: Home / Self Care | Payer: Medicare Other | Source: Ambulatory Visit | Attending: Internal Medicine | Admitting: Internal Medicine

## 2017-05-15 DIAGNOSIS — C439 Malignant melanoma of skin, unspecified: Secondary | ICD-10-CM | POA: Insufficient documentation

## 2017-05-15 LAB — GLUCOSE, CAPILLARY: Glucose-Capillary: 84 mg/dL (ref 65–99)

## 2017-05-15 MED ORDER — FLUDEOXYGLUCOSE F - 18 (FDG) INJECTION
13.8600 | Freq: Once | INTRAVENOUS | Status: AC | PRN
  Administered 2017-05-15: 13.86 via INTRAVENOUS

## 2017-05-20 ENCOUNTER — Inpatient Hospital Stay (HOSPITAL_BASED_OUTPATIENT_CLINIC_OR_DEPARTMENT_OTHER): Payer: Medicare Other | Admitting: Internal Medicine

## 2017-05-20 ENCOUNTER — Inpatient Hospital Stay: Payer: Medicare Other | Attending: Internal Medicine

## 2017-05-20 VITALS — BP 126/71 | HR 85 | Temp 97.8°F | Resp 18 | Ht 64.3 in | Wt 175.9 lb

## 2017-05-20 DIAGNOSIS — D509 Iron deficiency anemia, unspecified: Secondary | ICD-10-CM | POA: Insufficient documentation

## 2017-05-20 DIAGNOSIS — Z79899 Other long term (current) drug therapy: Secondary | ICD-10-CM | POA: Insufficient documentation

## 2017-05-20 DIAGNOSIS — M810 Age-related osteoporosis without current pathological fracture: Secondary | ICD-10-CM

## 2017-05-20 DIAGNOSIS — Z923 Personal history of irradiation: Secondary | ICD-10-CM | POA: Diagnosis not present

## 2017-05-20 DIAGNOSIS — I10 Essential (primary) hypertension: Secondary | ICD-10-CM | POA: Insufficient documentation

## 2017-05-20 DIAGNOSIS — R911 Solitary pulmonary nodule: Secondary | ICD-10-CM | POA: Insufficient documentation

## 2017-05-20 DIAGNOSIS — E049 Nontoxic goiter, unspecified: Secondary | ICD-10-CM | POA: Diagnosis not present

## 2017-05-20 DIAGNOSIS — Z90710 Acquired absence of both cervix and uterus: Secondary | ICD-10-CM | POA: Insufficient documentation

## 2017-05-20 DIAGNOSIS — Z85820 Personal history of malignant melanoma of skin: Secondary | ICD-10-CM

## 2017-05-20 DIAGNOSIS — J321 Chronic frontal sinusitis: Secondary | ICD-10-CM | POA: Insufficient documentation

## 2017-05-20 DIAGNOSIS — E7800 Pure hypercholesterolemia, unspecified: Secondary | ICD-10-CM | POA: Insufficient documentation

## 2017-05-20 DIAGNOSIS — Z9011 Acquired absence of right breast and nipple: Secondary | ICD-10-CM | POA: Diagnosis not present

## 2017-05-20 DIAGNOSIS — Q8500 Neurofibromatosis, unspecified: Secondary | ICD-10-CM | POA: Diagnosis not present

## 2017-05-20 DIAGNOSIS — I700 Atherosclerosis of aorta: Secondary | ICD-10-CM

## 2017-05-20 DIAGNOSIS — M069 Rheumatoid arthritis, unspecified: Secondary | ICD-10-CM

## 2017-05-20 DIAGNOSIS — C439 Malignant melanoma of skin, unspecified: Secondary | ICD-10-CM

## 2017-05-20 DIAGNOSIS — Z7982 Long term (current) use of aspirin: Secondary | ICD-10-CM

## 2017-05-20 LAB — COMPREHENSIVE METABOLIC PANEL
ALBUMIN: 4.1 g/dL (ref 3.5–5.0)
ALT: 16 U/L (ref 14–54)
ANION GAP: 9 (ref 5–15)
AST: 16 U/L (ref 15–41)
Alkaline Phosphatase: 48 U/L (ref 38–126)
BILIRUBIN TOTAL: 0.5 mg/dL (ref 0.3–1.2)
BUN: 15 mg/dL (ref 6–20)
CHLORIDE: 102 mmol/L (ref 101–111)
CO2: 26 mmol/L (ref 22–32)
Calcium: 9 mg/dL (ref 8.9–10.3)
Creatinine, Ser: 0.71 mg/dL (ref 0.44–1.00)
GFR calc Af Amer: >60 mL/min (ref >60)
GFR calc non Af Amer: >60 mL/min (ref >60)
GLUCOSE: 120 mg/dL — AB (ref 65–99)
POTASSIUM: 4 mmol/L (ref 3.5–5.1)
SODIUM: 137 mmol/L (ref 135–145)
TOTAL PROTEIN: 7.7 g/dL (ref 6.5–8.1)

## 2017-05-20 LAB — CBC WITH DIFFERENTIAL/PLATELET
BASOS ABS: 0.1 10*3/uL (ref 0–0.1)
BASOS PCT: 1 %
EOS ABS: 0.1 10*3/uL (ref 0–0.7)
EOS PCT: 1 %
HEMATOCRIT: 35.3 % (ref 35.0–47.0)
Hemoglobin: 11.6 g/dL — ABNORMAL LOW (ref 12.0–16.0)
Lymphocytes Relative: 15 %
Lymphs Abs: 1 10*3/uL (ref 1.0–3.6)
MCH: 26.2 pg (ref 26.0–34.0)
MCHC: 32.9 g/dL (ref 32.0–36.0)
MCV: 79.5 fL — ABNORMAL LOW (ref 80.0–100.0)
MONO ABS: 0.5 10*3/uL (ref 0.2–0.9)
Monocytes Relative: 7 %
Neutro Abs: 5.2 10*3/uL (ref 1.4–6.5)
Neutrophils Relative %: 76 %
PLATELETS: 251 10*3/uL (ref 150–440)
RBC: 4.44 MIL/uL (ref 3.80–5.20)
RDW: 17 % — AB (ref 11.5–14.5)
WBC: 6.9 10*3/uL (ref 3.6–11.0)

## 2017-05-20 LAB — IRON AND TIBC
Iron: 39 ug/dL (ref 28–170)
SATURATION RATIOS: 15 % (ref 10.4–31.8)
TIBC: 269 ug/dL (ref 250–450)
UIBC: 230 ug/dL

## 2017-05-20 LAB — FERRITIN: Ferritin: 909 ng/mL — ABNORMAL HIGH (ref 11–307)

## 2017-05-20 LAB — LACTATE DEHYDROGENASE: LDH: 120 U/L (ref 98–192)

## 2017-05-20 NOTE — Progress Notes (Signed)
[place] OFFICE PROGRESS NOTE  Patient Care Team: Glendon Axe, MD as PCP - General (Internal Medicine)  No matching staging information was found for the patient.   Oncology History   # June 2015- STAGE II- RIGHT BREAST SKIN MELANOMA- [ Dr. 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    # Rheumatoid arthritis on methotrexate and Enbrel; hypertension and NEUROFIBROMATOSIS  # MOLECULAR-  BRAF V600E negative; colo- Dr.Elliot [2015]     [name] ([place])     INTERVAL HISTORY:  Kristina Pittman 71 y.o.  female pleasant patient above history of Melanoma is here for follow-up.   Patient denies any new moles. Patient denies any headaches. Patient denies any new lumps or bumps. Appetite is good. She is gaining weight. No nausea no vomiting. No shortness of breath or cough.    REVIEW OF SYSTEMS:  A complete 10 point review of system is done which is negative except mentioned above/history of present illness.   PAST MEDICAL HISTORY :  Past Medical History:  Diagnosis Date  . Arthritis   . Depression   . Hypercholesterolemia   . Hyperlipidemia   . Hypertension   . Iron deficiency anemia   . Iron deficiency anemia   . Neurofibromatosis ([place])   . Osteoporosis   . Skin cancer    melanoma    PAST SURGICAL HISTORY :   Past Surgical History:  Procedure Laterality Date  . ABDOMINAL HYSTERECTOMY    . Excision  right breast mass      FAMILY HISTORY :  No family history on file.  SOCIAL HISTORY:   Social History  Substance Use Topics  . Smoking status: Never Smoker  . Smokeless tobacco: Never Used  . Alcohol use Not on file     ALLERGIES:  is allergic to rosuvastatin and sulfa antibiotics.  MEDICATIONS:  Current Outpatient Prescriptions  Medication Sig Dispense Refill  . alendronate (FOSAMAX) 70 MG tablet TAKE 1 TABLET BY MOUTH EVERY 7 DAYS. TAKE WITH A FULL GLASS OF WATER AND DO NOT LIE DOWN FOR THE NEXT 30 MINUTES.    Marland Kitchen aspirin EC 81 MG tablet Take by mouth.    . Calcium-Vitamin D 600-200 MG-UNIT per tablet Take by mouth.    . Cholecalciferol 1000 units tablet Take 1,000 Units by mouth daily.    Marland Kitchen ezetimibe (ZETIA) 10 MG tablet Take 10 mg by mouth daily.     . ferrous sulfate 325 (65 FE) MG EC tablet Take 325 mg by mouth daily with breakfast.     . folic acid (FOLVITE) 1 MG tablet Take 1 mg by mouth daily.     . methotrexate (RHEUMATREX) 2.5 MG tablet Take by mouth.    . Multiple Vitamin (MULTI-VITAMINS) TABS Take by mouth.    . simvastatin (ZOCOR) 20 MG tablet Take 20 mg by mouth daily.     . valsartan-hydrochlorothiazide (DIOVAN-HCT) 80-12.5 MG per tablet Take 1 tablet by mouth daily.      No current facility-administered medications for this visit.    Facility-Administered Medications Ordered in Other Visits  Medication Dose Route Frequency Provider Last Rate Last Dose  . peginterferon  alfa-2b (SYLATRON) injection 198 mcg  3 mcg/kg Subcutaneous Once Choksi, Delorise Shiner, MD      . peginterferon alfa-2b (SYLATRON) injection 198 mcg  3 mcg/kg Subcutaneous Once Forest Gleason, MD        PHYSICAL EXAMINATION: ECOG PERFORMANCE STATUS: 0 - Asymptomatic  BP 126/71 (Patient Position: Sitting)   Pulse 85   Temp 97.8 F (36.6 C) (Oral)   Resp 18   Ht 5' 4.3" (1.633 m)   Wt 175 lb 14.8 oz (79.8 kg)   BMI 29.92 kg/m   Filed Weights   05/20/17 1035  Weight: 175 lb 14.8 oz (79.8 kg)    GENERAL: Well-nourished well-developed; Alert, no distress and comfortable.  Alone,  EYES: no pallor or icterus OROPHARYNX: no thrush or ulceration; good dentition  NECK: supple, no masses felt LYMPH:  no palpable  lymphadenopathy in the cervical, axillary or inguinal regions LUNGS: clear to auscultation and  No wheeze or crackles HEART/CVS: regular rate & rhythm and no murmurs; No lower extremity edema ABDOMEN:abdomen soft, non-tender and normal bowel sounds Musculoskeletal:no cyanosis of digits and no clubbing  PSYCH: alert & oriented x 3 with fluent speech NEURO: no focal motor/sensory deficits SKIN:  Multiple nodules suggestive of neurofibromatosis noted; none new.  Right mastectomy site noted; pigmentation secondary to radiation.  Otherwise no nodules noted   LABORATORY DATA:  I have reviewed the data as listed    Component Value Date/Time   NA 137 05/20/2017 1022   NA 141 03/02/2015 0952   K 4.0 05/20/2017 1022   K 3.9 03/02/2015 0952   CL 102 05/20/2017 1022   CL 106 03/02/2015 0952   CO2 26 05/20/2017 1022   CO2 28 03/02/2015 0952   GLUCOSE 120 (H) 05/20/2017 1022   GLUCOSE 97 03/02/2015 0952   BUN 15 05/20/2017 1022   BUN 10 03/02/2015 0952   CREATININE 0.71 05/20/2017 1022   CREATININE 0.57 03/02/2015 0952   CALCIUM 9.0 05/20/2017 1022   CALCIUM 9.1 03/02/2015 0952   PROT 7.7 05/20/2017 1022   PROT 7.4 03/02/2015 0952   ALBUMIN 4.1 05/20/2017 1022   ALBUMIN 4.2 03/02/2015 0952   AST 16 05/20/2017 1022   AST 24 03/02/2015 0952   ALT 16 05/20/2017 1022   ALT 21 03/02/2015 0952   ALKPHOS 48 05/20/2017 1022   ALKPHOS 41 03/02/2015 0952   BILITOT 0.5 05/20/2017 1022   BILITOT 0.8 03/02/2015 0952   GFRNONAA >60 05/20/2017 1022   GFRNONAA >60 03/02/2015 0952   GFRAA >60 05/20/2017 1022   GFRAA >60 03/02/2015 0952    No results found for: SPEP, UPEP  Lab Results  Component Value Date   WBC 6.9 05/20/2017   NEUTROABS 5.2 05/20/2017   HGB 11.6 (L) 05/20/2017   HCT 35.3 05/20/2017   MCV 79.5 (L) 05/20/2017   PLT 251 05/20/2017      Chemistry      Component Value Date/Time   NA 137 05/20/2017 1022   NA 141 03/02/2015 0952   K 4.0 05/20/2017 1022   K 3.9 03/02/2015  0952   CL 102 05/20/2017 1022   CL 106 03/02/2015 0952   CO2 26 05/20/2017 1022   CO2 28 03/02/2015 0952   BUN 15 05/20/2017 1022   BUN 10 03/02/2015 0952   CREATININE 0.71 05/20/2017 1022   CREATININE 0.57 03/02/2015 0952      Component Value Date/Time   CALCIUM 9.0 05/20/2017 1022   CALCIUM 9.1 03/02/2015 0952   ALKPHOS 48 05/20/2017 1022  ALKPHOS 41 03/02/2015 0952   AST 16 05/20/2017 1022   AST 24 03/02/2015 0952   ALT 16 05/20/2017 1022   ALT 21 03/02/2015 0952   BILITOT 0.5 05/20/2017 1022   BILITOT 0.8 03/02/2015 0952     IMPRESSION: 1. No findings of recurrent malignancy. 2. Other imaging findings of potential clinical significance: Fat necrosis along the left lateral pelvic soft tissues, unchanged from prior. Thyroid goiter, without accentuated metabolic activity. Chronic left ethmoid sinusitis along with left frontal sinusitis. Aortic Atherosclerosis (ICD10-I70.0). Mild cardiomegaly. Bibasilar scarring in the lungs.   Electronically Signed   By: Van Clines M.D.   On: 05/15/2017 14:10   RADIOGRAPHIC STUDIES: I have personally reviewed the radiological images as listed and agreed with the findings in the report. No results found.   ASSESSMENT & PLAN:  [name] ([place]) Melanoma/ right breast- status post mastectomt followed by radiation followed by adjuvant pegylated interferon for one year [finished August 2016]. Follow-up PET scan July 2018- does not show any evidence of recurrence.   # clinically no evidence of disease. Labs - Normal.   # anemia- microcytic- colono-2015; will add iron studies.. If low stool tests/po iron.  # Right upper lobe lung nodule 3 mm- likely benign [stable from 2015] ; None on PET.   # left abdominal wall fat nexcrosis  # Screening mammogram- left sided ordered for jan 2018.   # patient follow-up with me in approximately 6 months CBC CMP and LDH; will order scan at that viist.   # 25 minutes face-to-face  with the patient discussing the above plan of care; more than 50% of time spent on prognosis/ natural history; counseling and coordination.   Orders Placed This Encounter  Procedures  . Comprehensive metabolic panel    Standing Status:   Future    Standing Expiration Date:   05/20/2018  . CBC with Differential/Platelet    Standing Status:   Future    Standing Expiration Date:   05/20/2018  . Iron and TIBC    Standing Status:   Future    Standing Expiration Date:   05/20/2018  . Ferritin    Standing Status:   Future    Standing Expiration Date:   05/20/2018  . Lactate dehydrogenase    Standing Status:   Future    Standing Expiration Date:   05/20/2018  . Iron and TIBC    Standing Status:   Future    Number of Occurrences:   1    Standing Expiration Date:   05/20/2017  . Ferritin    Standing Status:   Future    Number of Occurrences:   1    Standing Expiration Date:   05/20/2017   All questions were answered. The patient knows to call the clinic with any problems, questions or concerns.      Cammie Sickle, MD 05/20/2017 5:53 PM

## 2017-05-20 NOTE — Assessment & Plan Note (Addendum)
Melanoma/ right breast- status post mastectomt followed by radiation followed by adjuvant pegylated interferon for one year [finished August 2016]. Follow-up PET scan July 2018- does not show any evidence of recurrence.   # clinically no evidence of disease. Labs - Normal.   # anemia- microcytic- colono-2015; will add iron studies.. If low stool tests/po iron.  # Right upper lobe lung nodule 3 mm- likely benign [stable from 2015] ; None on PET.   # left abdominal wall fat nexcrosis  # Screening mammogram- left sided ordered for jan 2018.   # patient follow-up with me in approximately 6 months CBC CMP and LDH; will order scan at that viist.   Addendum- iron studies suggestive of chronic disease; no deficiency noted. Ferritin greater than 900; saturation 15%. likely secondary  to rheumatoid arthritis.

## 2017-09-13 ENCOUNTER — Encounter: Payer: Self-pay | Admitting: Emergency Medicine

## 2017-09-13 ENCOUNTER — Other Ambulatory Visit: Payer: Self-pay

## 2017-09-13 ENCOUNTER — Emergency Department
Admission: EM | Admit: 2017-09-13 | Discharge: 2017-09-14 | Disposition: A | Discharge status: Home / Self Care | Payer: Medicare Other | Attending: Emergency Medicine | Admitting: Emergency Medicine

## 2017-09-13 DIAGNOSIS — Z7982 Long term (current) use of aspirin: Secondary | ICD-10-CM | POA: Diagnosis not present

## 2017-09-13 DIAGNOSIS — Z85820 Personal history of malignant melanoma of skin: Secondary | ICD-10-CM | POA: Diagnosis not present

## 2017-09-13 DIAGNOSIS — I10 Essential (primary) hypertension: Secondary | ICD-10-CM | POA: Insufficient documentation

## 2017-09-13 DIAGNOSIS — Z79899 Other long term (current) drug therapy: Secondary | ICD-10-CM | POA: Diagnosis not present

## 2017-09-13 DIAGNOSIS — M069 Rheumatoid arthritis, unspecified: Secondary | ICD-10-CM | POA: Diagnosis not present

## 2017-09-13 LAB — BASIC METABOLIC PANEL
Anion gap: 10 (ref 5–15)
BUN: 8 mg/dL (ref 6–20)
CALCIUM: 9.8 mg/dL (ref 8.9–10.3)
CO2: 24 mmol/L (ref 22–32)
CREATININE: 0.67 mg/dL (ref 0.44–1.00)
Chloride: 106 mmol/L (ref 101–111)
GFR calc non Af Amer: >60 mL/min (ref >60)
GLUCOSE: 114 mg/dL — AB (ref 65–99)
Potassium: 3.7 mmol/L (ref 3.5–5.1)
Sodium: 140 mmol/L (ref 135–145)

## 2017-09-13 LAB — CBC
HCT: 38.1 % (ref 35.0–47.0)
Hemoglobin: 12.2 g/dL (ref 12.0–16.0)
MCH: 25.9 pg — AB (ref 26.0–34.0)
MCHC: 31.9 g/dL — AB (ref 32.0–36.0)
MCV: 81.2 fL (ref 80.0–100.0)
PLATELETS: 207 10*3/uL (ref 150–440)
RBC: 4.69 MIL/uL (ref 3.80–5.20)
RDW: 17.4 % — AB (ref 11.5–14.5)
WBC: 7.8 10*3/uL (ref 3.6–11.0)

## 2017-09-13 LAB — TROPONIN I

## 2017-09-13 NOTE — ED Notes (Signed)
Family to stat desk asking about wait time. Family given update on wait time.  
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
## 2017-11-11 ENCOUNTER — Inpatient Hospital Stay: Payer: Medicare Other | Attending: Internal Medicine

## 2017-11-11 DIAGNOSIS — Z923 Personal history of irradiation: Secondary | ICD-10-CM | POA: Diagnosis not present

## 2017-11-11 DIAGNOSIS — Z85820 Personal history of malignant melanoma of skin: Secondary | ICD-10-CM | POA: Diagnosis not present

## 2017-11-11 DIAGNOSIS — C439 Malignant melanoma of skin, unspecified: Secondary | ICD-10-CM

## 2017-11-11 DIAGNOSIS — K654 Sclerosing mesenteritis: Secondary | ICD-10-CM | POA: Insufficient documentation

## 2017-11-11 DIAGNOSIS — Z9011 Acquired absence of right breast and nipple: Secondary | ICD-10-CM | POA: Insufficient documentation

## 2017-11-11 DIAGNOSIS — R911 Solitary pulmonary nodule: Secondary | ICD-10-CM | POA: Diagnosis not present

## 2017-11-11 DIAGNOSIS — Z9221 Personal history of antineoplastic chemotherapy: Secondary | ICD-10-CM | POA: Diagnosis not present

## 2017-11-11 LAB — CBC WITH DIFFERENTIAL/PLATELET
BASOS PCT: 1 %
Basophils Absolute: 0.1 10*3/uL (ref 0–0.1)
EOS PCT: 1 %
Eosinophils Absolute: 0.1 10*3/uL (ref 0–0.7)
HCT: 37.9 % (ref 35.0–47.0)
Hemoglobin: 12.3 g/dL (ref 12.0–16.0)
Lymphocytes Relative: 23 %
Lymphs Abs: 1.3 10*3/uL (ref 1.0–3.6)
MCH: 26.6 pg (ref 26.0–34.0)
MCHC: 32.4 g/dL (ref 32.0–36.0)
MCV: 82 fL (ref 80.0–100.0)
MONO ABS: 0.5 10*3/uL (ref 0.2–0.9)
MONOS PCT: 9 %
NEUTROS ABS: 3.9 10*3/uL (ref 1.4–6.5)
Neutrophils Relative %: 66 %
PLATELETS: 225 10*3/uL (ref 150–440)
RBC: 4.63 MIL/uL (ref 3.80–5.20)
RDW: 17 % — AB (ref 11.5–14.5)
WBC: 5.9 10*3/uL (ref 3.6–11.0)

## 2017-11-11 LAB — IRON AND TIBC
Iron: 53 ug/dL (ref 28–170)
Saturation Ratios: 19 % (ref 10.4–31.8)
TIBC: 287 ug/dL (ref 250–450)
UIBC: 234 ug/dL

## 2017-11-11 LAB — COMPREHENSIVE METABOLIC PANEL
ALBUMIN: 4.2 g/dL (ref 3.5–5.0)
ALK PHOS: 49 U/L (ref 38–126)
ALT: 13 U/L — AB (ref 14–54)
AST: 15 U/L (ref 15–41)
Anion gap: 9 (ref 5–15)
BILIRUBIN TOTAL: 0.5 mg/dL (ref 0.3–1.2)
BUN: 12 mg/dL (ref 6–20)
CALCIUM: 9.1 mg/dL (ref 8.9–10.3)
CO2: 26 mmol/L (ref 22–32)
Chloride: 104 mmol/L (ref 101–111)
Creatinine, Ser: 0.67 mg/dL (ref 0.44–1.00)
GFR calc Af Amer: >60 mL/min (ref >60)
GFR calc non Af Amer: >60 mL/min (ref >60)
GLUCOSE: 120 mg/dL — AB (ref 65–99)
Potassium: 4.1 mmol/L (ref 3.5–5.1)
SODIUM: 139 mmol/L (ref 135–145)
TOTAL PROTEIN: 7.9 g/dL (ref 6.5–8.1)

## 2017-11-11 LAB — FERRITIN: Ferritin: 599 ng/mL — ABNORMAL HIGH (ref 11–307)

## 2017-11-11 LAB — LACTATE DEHYDROGENASE: LDH: 121 U/L (ref 98–192)

## 2017-11-13 ENCOUNTER — Other Ambulatory Visit: Payer: Medicare Other

## 2017-11-18 ENCOUNTER — Inpatient Hospital Stay (HOSPITAL_BASED_OUTPATIENT_CLINIC_OR_DEPARTMENT_OTHER): Payer: Medicare Other | Admitting: Internal Medicine

## 2017-11-18 ENCOUNTER — Encounter: Payer: Self-pay | Admitting: Internal Medicine

## 2017-11-18 ENCOUNTER — Other Ambulatory Visit: Payer: Self-pay

## 2017-11-18 VITALS — BP 153/89 | HR 80 | Temp 97.8°F | Resp 20 | Ht 62.0 in | Wt 176.4 lb

## 2017-11-18 DIAGNOSIS — Z9011 Acquired absence of right breast and nipple: Secondary | ICD-10-CM | POA: Diagnosis not present

## 2017-11-18 DIAGNOSIS — K654 Sclerosing mesenteritis: Secondary | ICD-10-CM

## 2017-11-18 DIAGNOSIS — Z9221 Personal history of antineoplastic chemotherapy: Secondary | ICD-10-CM | POA: Diagnosis not present

## 2017-11-18 DIAGNOSIS — C439 Malignant melanoma of skin, unspecified: Secondary | ICD-10-CM

## 2017-11-18 DIAGNOSIS — Z923 Personal history of irradiation: Secondary | ICD-10-CM | POA: Diagnosis not present

## 2017-11-18 DIAGNOSIS — R911 Solitary pulmonary nodule: Secondary | ICD-10-CM | POA: Diagnosis not present

## 2017-11-18 DIAGNOSIS — Z85820 Personal history of malignant melanoma of skin: Secondary | ICD-10-CM

## 2017-11-18 NOTE — Assessment & Plan Note (Addendum)
Melanoma/ right breast- status post mastectomty followed by radiation followed by adjuvant pegylated interferon for one year [finished August 2016]. Follow-up PET scan July 2018- does not show any evidence of recurrence.   # clinically no evidence of disease. Labs - Normal.   # Right upper lobe lung nodule 3 mm- likely benign [stable from 2015] ; None on PET; await the repeat imaging in 6 months.   # left abdominal wall fat necrosis- monitor for now.   # Screening mammogram- awaiting mammo- jan 25th 2019.   # patient follow-up with me in approximately 6 months CBC CMP and LDH; CT scans prior.

## 2017-11-18 NOTE — Progress Notes (Signed)
[place] OFFICE PROGRESS NOTE  Patient Care Team: Glendon Axe, MD as PCP - General (Internal Medicine)  No matching staging information was found for the patient.   Oncology History   # June 2015- STAGE II- RIGHT BREAST SKIN MELANOMA- [ Dr. 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    # Rheumatoid arthritis on methotrexate and Enbrel; hypertension and NEUROFIBROMATOSIS  # MOLECULAR-  BRAF V600E negative; colo- Dr.Elliot [2015]     [name] ([place])     INTERVAL HISTORY:  Kristina Pittman 72 y.o.  female pleasant patient above history of Melanoma is here for follow-up.    Appetite is good. She is gaining weight. No nausea no vomiting. No shortness of breath or cough.  Patient denies any new moles. Patient denies any headaches. Patient denies any new lumps or bumps.  REVIEW OF SYSTEMS:  A complete 10 point review of system is done which is negative except mentioned above/history of present illness.   PAST MEDICAL HISTORY :  Past Medical History:  Diagnosis Date  . Arthritis   . Depression   . Hypercholesterolemia   . Hyperlipidemia   . Hypertension   . Iron deficiency anemia   . Iron deficiency anemia   . Neurofibromatosis ([place])   . Osteoporosis   . Skin cancer    melanoma    PAST SURGICAL HISTORY :   Past Surgical History:  Procedure Laterality Date  . ABDOMINAL HYSTERECTOMY    . Excision  right breast mass      FAMILY HISTORY :  History reviewed. No pertinent family history.  SOCIAL HISTORY:   Social History   Tobacco Use  . Smoking status: Never Smoker  . Smokeless tobacco: Never Used  Substance Use  Topics  . Alcohol use: No    Frequency: Never  . Drug use: Not on file    ALLERGIES:  is allergic to rosuvastatin and sulfa antibiotics.  MEDICATIONS:  Current Outpatient Medications  Medication Sig Dispense Refill  . alendronate (FOSAMAX) 70 MG tablet TAKE 1 TABLET BY MOUTH EVERY 7 DAYS. TAKE WITH A FULL GLASS OF WATER AND DO NOT LIE DOWN FOR THE NEXT 30 MINUTES.    Marland Kitchen amLODipine (NORVASC) 2.5 MG tablet Take 1 tablet by mouth daily.    Marland Kitchen aspirin EC 81 MG tablet Take 81 mg by mouth daily.     . Calcium-Vitamin D 600-200 MG-UNIT per tablet Take 1 tablet by mouth daily.     . Cholecalciferol 1000 units tablet Take 1,000 Units by mouth daily.    Marland Kitchen ezetimibe (ZETIA) 10 MG tablet Take 10 mg by mouth daily.     . ferrous sulfate 325 (65 FE) MG EC tablet Take 325 mg by mouth daily with breakfast.     . folic acid (FOLVITE) 1 MG tablet Take 1 mg by mouth daily.     . methotrexate (RHEUMATREX) 2.5 MG tablet Take 2.5 mg by mouth once a week.     . Multiple Vitamin (MULTI-VITAMINS) TABS Take by mouth.    . simvastatin (ZOCOR) 20 MG tablet Take 20 mg by mouth daily.      No current  facility-administered medications for this visit.    Facility-Administered Medications Ordered in Other Visits  Medication Dose Route Frequency Provider Last Rate Last Dose  . peginterferon alfa-2b (SYLATRON) injection 198 mcg  3 mcg/kg Subcutaneous Once Choksi, Delorise Shiner, MD      . peginterferon alfa-2b (SYLATRON) injection 198 mcg  3 mcg/kg Subcutaneous Once Forest Gleason, MD        PHYSICAL EXAMINATION: ECOG PERFORMANCE STATUS: 0 - Asymptomatic  BP (!) 153/89 (BP Location: Left Arm, Patient Position: Sitting)   Pulse 80   Temp 97.8 F (36.6 C) (Tympanic)   Resp 20   Ht '5\' 2"'  (1.575 m)   Wt 176 lb 5.9 oz (80 kg)   BMI 32.26 kg/m   Filed Weights   11/18/17 1036  Weight: 176 lb 5.9 oz (80 kg)    GENERAL: Well-nourished well-developed; Alert, no distress and comfortable.  Alone,  EYES: no pallor or  icterus OROPHARYNX: no thrush or ulceration; good dentition  NECK: supple, no masses felt LYMPH:  no palpable lymphadenopathy in the cervical, axillary or inguinal regions LUNGS: clear to auscultation and  No wheeze or crackles HEART/CVS: regular rate & rhythm and no murmurs; No lower extremity edema ABDOMEN:abdomen soft, non-tender and normal bowel sounds Musculoskeletal:no cyanosis of digits and no clubbing  PSYCH: alert & oriented x 3 with fluent speech NEURO: no focal motor/sensory deficits SKIN:  Multiple nodules suggestive of neurofibromatosis noted; none new.  Right mastectomy site noted; pigmentation secondary to radiation.  Otherwise no nodules noted   LABORATORY DATA:  I have reviewed the data as listed    Component Value Date/Time   NA 139 11/11/2017 0932   NA 141 03/02/2015 0952   K 4.1 11/11/2017 0932   K 3.9 03/02/2015 0952   CL 104 11/11/2017 0932   CL 106 03/02/2015 0952   CO2 26 11/11/2017 0932   CO2 28 03/02/2015 0952   GLUCOSE 120 (H) 11/11/2017 0932   GLUCOSE 97 03/02/2015 0952   BUN 12 11/11/2017 0932   BUN 10 03/02/2015 0952   CREATININE 0.67 11/11/2017 0932   CREATININE 0.57 03/02/2015 0952   CALCIUM 9.1 11/11/2017 0932   CALCIUM 9.1 03/02/2015 0952   PROT 7.9 11/11/2017 0932   PROT 7.4 03/02/2015 0952   ALBUMIN 4.2 11/11/2017 0932   ALBUMIN 4.2 03/02/2015 0952   AST 15 11/11/2017 0932   AST 24 03/02/2015 0952   ALT 13 (L) 11/11/2017 0932   ALT 21 03/02/2015 0952   ALKPHOS 49 11/11/2017 0932   ALKPHOS 41 03/02/2015 0952   BILITOT 0.5 11/11/2017 0932   BILITOT 0.8 03/02/2015 0952   GFRNONAA >60 11/11/2017 0932   GFRNONAA >60 03/02/2015 0952   GFRAA >60 11/11/2017 0932   GFRAA >60 03/02/2015 0952    No results found for: SPEP, UPEP  Lab Results  Component Value Date   WBC 5.9 11/11/2017   NEUTROABS 3.9 11/11/2017   HGB 12.3 11/11/2017   HCT 37.9 11/11/2017   MCV 82.0 11/11/2017   PLT 225 11/11/2017      Chemistry      Component  Value Date/Time   NA 139 11/11/2017 0932   NA 141 03/02/2015 0952   K 4.1 11/11/2017 0932   K 3.9 03/02/2015 0952   CL 104 11/11/2017 0932   CL 106 03/02/2015 0952   CO2 26 11/11/2017 0932   CO2 28 03/02/2015 0952   BUN 12 11/11/2017 0932   BUN 10 03/02/2015 0952   CREATININE 0.67 11/11/2017 0932  CREATININE 0.57 03/02/2015 0952      Component Value Date/Time   CALCIUM 9.1 11/11/2017 0932   CALCIUM 9.1 03/02/2015 0952   ALKPHOS 49 11/11/2017 0932   ALKPHOS 41 03/02/2015 0952   AST 15 11/11/2017 0932   AST 24 03/02/2015 0952   ALT 13 (L) 11/11/2017 0932   ALT 21 03/02/2015 0952   BILITOT 0.5 11/11/2017 0932   BILITOT 0.8 03/02/2015 0952     IMPRESSION: 1. No findings of recurrent malignancy. 2. Other imaging findings of potential clinical significance: Fat necrosis along the left lateral pelvic soft tissues, unchanged from prior. Thyroid goiter, without accentuated metabolic activity. Chronic left ethmoid sinusitis along with left frontal sinusitis. Aortic Atherosclerosis (ICD10-I70.0). Mild cardiomegaly. Bibasilar scarring in the lungs.   Electronically Signed   By: Van Clines M.D.   On: 05/15/2017 14:10   RADIOGRAPHIC STUDIES: I have personally reviewed the radiological images as listed and agreed with the findings in the report. No results found.   ASSESSMENT & PLAN:  [name] ([place]) Melanoma/ right breast- status post mastectomty followed by radiation followed by adjuvant pegylated interferon for one year [finished August 2016]. Follow-up PET scan July 2018- does not show any evidence of recurrence.   # clinically no evidence of disease. Labs - Normal.   # Right upper lobe lung nodule 3 mm- likely benign [stable from 2015] ; None on PET; await the repeat imaging in 6 months.   # left abdominal wall fat necrosis- monitor for now.   # Screening mammogram- awaiting mammo- jan 25th 2019.   # patient follow-up with me in approximately 6  months CBC CMP and LDH; CT scans prior.     Orders Placed This Encounter  Procedures  . CT CHEST W CONTRAST    Standing Status:   Future    Standing Expiration Date:   11/18/2018    Scheduling Instructions:     1-2 days prior to MD visit in 6 months.    Order Specific Question:   If indicated for the ordered procedure, I authorize the administration of contrast media per Radiology protocol    Answer:   Yes    Order Specific Question:   Preferred imaging location?    Answer:   [facility]    Order Specific Question:   Radiology Contrast Protocol - do NOT remove file path    Answer:   \\[email or web address]  . CT Abdomen Pelvis W Contrast    Standing Status:   Future    Standing Expiration Date:   11/18/2018    Scheduling Instructions:     1-2 days prior to MD visit in 6 months.    Order Specific Question:   If indicated for the ordered procedure, I authorize the administration of contrast media per Radiology protocol    Answer:   Yes    Order Specific Question:   Preferred imaging location?    Answer:   [facility]    Order Specific Question:   Call Results- Best Contact Number?    Answer:   [id number]    Order Specific Question:   Radiology Contrast Protocol - do NOT remove file path    Answer:   \\[email or web address]  . CBC with Differential/Platelet    Standing Status:   Future    Standing Expiration Date:   11/18/2018  . Comprehensive metabolic panel    Standing Status:   Future    Standing Expiration Date:   11/18/2018  . Lactate dehydrogenase  Standing Status:   Future    Standing Expiration Date:   11/18/2018   All questions were answered. The patient knows to call the clinic with any problems, questions or concerns.      Cammie Sickle, MD 11/25/2017 1:54 PM

## 2017-11-27 ENCOUNTER — Other Ambulatory Visit: Payer: Self-pay | Admitting: Surgery

## 2017-11-27 DIAGNOSIS — Z1239 Encounter for other screening for malignant neoplasm of breast: Secondary | ICD-10-CM

## 2017-12-01 ENCOUNTER — Other Ambulatory Visit: Payer: Self-pay | Admitting: *Deleted

## 2017-12-01 ENCOUNTER — Inpatient Hospital Stay
Admission: RE | Admit: 2017-12-01 | Discharge: 2017-12-01 | Disposition: A | Discharge status: Home / Self Care | Payer: Self-pay | Source: Ambulatory Visit | Attending: *Deleted | Admitting: *Deleted

## 2017-12-01 DIAGNOSIS — Z9289 Personal history of other medical treatment: Secondary | ICD-10-CM

## 2017-12-04 ENCOUNTER — Ambulatory Visit
Admission: RE | Admit: 2017-12-04 | Discharge: 2017-12-04 | Disposition: A | Discharge status: Home / Self Care | Payer: Medicare Other | Source: Ambulatory Visit | Attending: Surgery | Admitting: Surgery

## 2017-12-04 ENCOUNTER — Other Ambulatory Visit: Payer: Self-pay | Admitting: Surgery

## 2017-12-04 DIAGNOSIS — Z1239 Encounter for other screening for malignant neoplasm of breast: Secondary | ICD-10-CM

## 2017-12-04 DIAGNOSIS — Z1231 Encounter for screening mammogram for malignant neoplasm of breast: Secondary | ICD-10-CM | POA: Insufficient documentation

## 2017-12-04 HISTORY — DX: Malignant neoplasm of unspecified site of unspecified female breast: C50.919

## 2017-12-04 HISTORY — DX: Personal history of antineoplastic chemotherapy: Z92.21

## 2017-12-04 HISTORY — DX: Personal history of irradiation: Z92.3

## 2017-12-31 ENCOUNTER — Ambulatory Visit
Admission: RE | Admit: 2017-12-31 | Discharge: 2017-12-31 | Disposition: A | Discharge status: Home / Self Care | Payer: Medicare Other | Source: Ambulatory Visit | Attending: Radiation Oncology | Admitting: Radiation Oncology

## 2017-12-31 ENCOUNTER — Encounter: Payer: Self-pay | Admitting: Radiation Oncology

## 2017-12-31 ENCOUNTER — Other Ambulatory Visit: Payer: Self-pay

## 2017-12-31 VITALS — BP 160/86 | HR 83 | Temp 96.8°F | Resp 20 | Wt 173.9 lb

## 2017-12-31 DIAGNOSIS — Z85820 Personal history of malignant melanoma of skin: Secondary | ICD-10-CM | POA: Insufficient documentation

## 2017-12-31 DIAGNOSIS — Z853 Personal history of malignant neoplasm of breast: Secondary | ICD-10-CM | POA: Insufficient documentation

## 2017-12-31 DIAGNOSIS — Z9011 Acquired absence of right breast and nipple: Secondary | ICD-10-CM | POA: Diagnosis not present

## 2017-12-31 DIAGNOSIS — C439 Malignant melanoma of skin, unspecified: Secondary | ICD-10-CM

## 2017-12-31 NOTE — Progress Notes (Signed)
Radiation Oncology Follow up Note  Name: Kristina Pittman   Date:   12/31/2017 MRN:  [id number] DOB: [birth year]    This 72 y.o. female presents to the clinic today for a 3.5 year follow-up status post chest wall and peripheral lymphatic radiation to her right chest wall for malignant melanoma status post mastectomy of the right breast.  REFERRING PROVIDER: Glendon Axe, MD  HPI: Patient is a 72 year old female now out 3.5 years having completed right chest wall peripheral lymphatic radiation status post mastectomy for malignant melanoma of her right breast. Seen today in routine follow-up she is doing well. She specifically denies any new nodularity or masses cough or bone pain. She had a PET CT scan which I have reviewed back in July 2018 showing no evidence to suggest malignancy.  COMPLICATIONS OF TREATMENT: none  FOLLOW UP COMPLIANCE: keeps appointments   PHYSICAL EXAM:  BP (!) 160/86   Pulse 83   Temp (!) 96.8 F (36 C)   Resp 20   Wt 173 lb 15.1 oz (78.9 kg)   BMI 31.81 kg/m  Patient is status post right mastectomy. Chest walls clear. Left breast is free of dominant mass or nodularity in 2 positions. No axillary or supraclavicular adenopathy is noted. No lymphedema in her right upper extremity is noted. Well-developed well-nourished patient in NAD. HEENT reveals PERLA, EOMI, discs not visualized.  Oral cavity is clear. No oral mucosal lesions are identified. Neck is clear without evidence of cervical or supraclavicular adenopathy. Lungs are clear to A&P. Cardiac examination is essentially unremarkable with regular rate and rhythm without murmur rub or thrill. Abdomen is benign with no organomegaly or masses noted. Motor sensory and DTR levels are equal and symmetric in the upper and lower extremities. Cranial nerves II through XII are grossly intact. Proprioception is intact. No peripheral adenopathy or edema is identified. No motor or sensory levels are noted. Crude visual fields are  within normal range.  RADIOLOGY RESULTS: PET CT scan is reviewed and compatible with the above-stated findings  PLAN: Present time patient is doing well with no evidence of disease. At this time based on transportation issues I'm going to turn follow-up care over to medical oncology. I would be happy to reevaluate the patient any time should further treatment be indicated.  I would like to take this opportunity to thank you for allowing me to participate in the care of your patient.Noreene Filbert, MD

## 2018-05-14 ENCOUNTER — Ambulatory Visit
Admission: RE | Admit: 2018-05-14 | Discharge: 2018-05-14 | Disposition: A | Discharge status: Home / Self Care | Payer: Medicare Other | Source: Ambulatory Visit | Attending: Internal Medicine | Admitting: Internal Medicine

## 2018-05-14 ENCOUNTER — Inpatient Hospital Stay: Payer: Medicare Other | Attending: Internal Medicine

## 2018-05-14 DIAGNOSIS — Z9221 Personal history of antineoplastic chemotherapy: Secondary | ICD-10-CM | POA: Insufficient documentation

## 2018-05-14 DIAGNOSIS — Z85820 Personal history of malignant melanoma of skin: Secondary | ICD-10-CM | POA: Insufficient documentation

## 2018-05-14 DIAGNOSIS — C439 Malignant melanoma of skin, unspecified: Secondary | ICD-10-CM | POA: Diagnosis present

## 2018-05-14 DIAGNOSIS — I10 Essential (primary) hypertension: Secondary | ICD-10-CM | POA: Insufficient documentation

## 2018-05-14 DIAGNOSIS — Z9011 Acquired absence of right breast and nipple: Secondary | ICD-10-CM | POA: Insufficient documentation

## 2018-05-14 DIAGNOSIS — M069 Rheumatoid arthritis, unspecified: Secondary | ICD-10-CM | POA: Diagnosis not present

## 2018-05-14 DIAGNOSIS — Z923 Personal history of irradiation: Secondary | ICD-10-CM | POA: Diagnosis not present

## 2018-05-14 HISTORY — DX: Systemic involvement of connective tissue, unspecified: M35.9

## 2018-05-14 LAB — COMPREHENSIVE METABOLIC PANEL
ALBUMIN: 4.3 g/dL (ref 3.5–5.0)
ALT: 13 U/L (ref 0–44)
ANION GAP: 12 (ref 5–15)
AST: 15 U/L (ref 15–41)
Alkaline Phosphatase: 47 U/L (ref 38–126)
BUN: 18 mg/dL (ref 8–23)
CO2: 24 mmol/L (ref 22–32)
Calcium: 9 mg/dL (ref 8.9–10.3)
Chloride: 103 mmol/L (ref 98–111)
Creatinine, Ser: 0.78 mg/dL (ref 0.44–1.00)
GFR calc Af Amer: >60 mL/min (ref >60)
GFR calc non Af Amer: >60 mL/min (ref >60)
GLUCOSE: 131 mg/dL — AB (ref 70–99)
POTASSIUM: 3.8 mmol/L (ref 3.5–5.1)
SODIUM: 139 mmol/L (ref 135–145)
Total Bilirubin: 0.6 mg/dL (ref 0.3–1.2)
Total Protein: 7.6 g/dL (ref 6.5–8.1)

## 2018-05-14 LAB — CBC WITH DIFFERENTIAL/PLATELET
BASOS PCT: 1 %
Basophils Absolute: 0.1 10*3/uL (ref 0–0.1)
EOS ABS: 0.1 10*3/uL (ref 0–0.7)
Eosinophils Relative: 1 %
HCT: 38.3 % (ref 35.0–47.0)
Hemoglobin: 12.4 g/dL (ref 12.0–16.0)
Lymphocytes Relative: 23 %
Lymphs Abs: 1.5 10*3/uL (ref 1.0–3.6)
MCH: 26.3 pg (ref 26.0–34.0)
MCHC: 32.2 g/dL (ref 32.0–36.0)
MCV: 81.5 fL (ref 80.0–100.0)
MONO ABS: 0.6 10*3/uL (ref 0.2–0.9)
MONOS PCT: 9 %
NEUTROS PCT: 66 %
Neutro Abs: 4.1 10*3/uL (ref 1.4–6.5)
Platelets: 197 10*3/uL (ref 150–440)
RBC: 4.7 MIL/uL (ref 3.80–5.20)
RDW: 17.3 % — AB (ref 11.5–14.5)
WBC: 6.3 10*3/uL (ref 3.6–11.0)

## 2018-05-14 LAB — LACTATE DEHYDROGENASE: LDH: 114 U/L (ref 98–192)

## 2018-05-14 MED ORDER — IOPAMIDOL (ISOVUE-300) INJECTION 61%
100.0000 mL | Freq: Once | INTRAVENOUS | Status: AC | PRN
  Administered 2018-05-14: 100 mL via INTRAVENOUS

## 2018-05-19 ENCOUNTER — Inpatient Hospital Stay (HOSPITAL_BASED_OUTPATIENT_CLINIC_OR_DEPARTMENT_OTHER): Payer: Medicare Other | Admitting: Internal Medicine

## 2018-05-19 ENCOUNTER — Encounter: Payer: Self-pay | Admitting: Internal Medicine

## 2018-05-19 ENCOUNTER — Other Ambulatory Visit: Payer: Medicare Other

## 2018-05-19 VITALS — BP 163/82 | HR 86 | Temp 97.9°F | Resp 16 | Wt 168.0 lb

## 2018-05-19 DIAGNOSIS — Z9221 Personal history of antineoplastic chemotherapy: Secondary | ICD-10-CM

## 2018-05-19 DIAGNOSIS — M069 Rheumatoid arthritis, unspecified: Secondary | ICD-10-CM

## 2018-05-19 DIAGNOSIS — Z85820 Personal history of malignant melanoma of skin: Secondary | ICD-10-CM

## 2018-05-19 DIAGNOSIS — Z9011 Acquired absence of right breast and nipple: Secondary | ICD-10-CM | POA: Diagnosis not present

## 2018-05-19 DIAGNOSIS — C439 Malignant melanoma of skin, unspecified: Secondary | ICD-10-CM

## 2018-05-19 DIAGNOSIS — Z923 Personal history of irradiation: Secondary | ICD-10-CM

## 2018-05-19 DIAGNOSIS — I10 Essential (primary) hypertension: Secondary | ICD-10-CM | POA: Diagnosis not present

## 2018-05-19 NOTE — Progress Notes (Signed)
[place] OFFICE PROGRESS NOTE  Patient Care Team: Glendon Axe, MD as PCP - General (Internal Medicine)  Cancer Staging No matching staging information was found for the patient.   Oncology History   # June 2015- STAGE II- RIGHT BREAST SKIN MELANOMA- [ Dr. 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    # Rheumatoid arthritis on methotrexate and Enbrel; hypertension and NEUROFIBROMATOSIS  # MOLECULAR-  BRAF V600E negative; colo- Dr.Elliot [2015]     [name] ([place])      INTERVAL HISTORY:  Kristina Pittman 72 y.o.  female pleasant patient above history of stage II melanoma status post resection followed by adjuvant therapy is here for follow-up review the results of the CT scan.  Patient denies any lumps or bumps.  Appetite is good.  No weight loss.  No nausea no vomiting.  No headaches.  Review of Systems  Constitutional: Negative for chills, diaphoresis, fever, malaise/fatigue and weight loss.  HENT: Negative for nosebleeds and sore throat.   Eyes: Negative for double vision.  Respiratory: Negative for cough, hemoptysis, sputum production, shortness of breath and wheezing.   Cardiovascular: Negative for chest pain, palpitations, orthopnea and leg swelling.  Gastrointestinal: Negative for abdominal pain, blood in stool, constipation, diarrhea, heartburn, melena, nausea and vomiting.  Genitourinary: Negative for dysuria, frequency and urgency.  Musculoskeletal: Negative for back pain and joint pain.  Skin: Negative.  Negative for itching and rash.  Neurological: Negative for dizziness, tingling, focal weakness, weakness and  headaches.  Endo/Heme/Allergies: Does not bruise/bleed easily.  Psychiatric/Behavioral: Negative for depression. The patient is not nervous/anxious and does not have insomnia.       PAST MEDICAL HISTORY :  Past Medical History:  Diagnosis Date  . Arthritis   . Breast cancer ([place])   . Collagen vascular disease ([facility])    RA  . Depression   . Hypercholesterolemia   . Hyperlipidemia   . Hypertension   . Iron deficiency anemia   . Iron deficiency anemia   . Neurofibromatosis ([place])   . Osteoporosis   . Personal history of chemotherapy   . Personal history of radiation therapy   . Skin cancer    melanoma    PAST SURGICAL HISTORY :   Past Surgical History:  Procedure Laterality Date  . ABDOMINAL HYSTERECTOMY    . Excision  right breast mass    . MASTECTOMY Right 2015    FAMILY HISTORY :   Family History  Problem Relation Age of Onset  . Breast cancer Neg Hx     SOCIAL HISTORY:   Social History   Tobacco Use  . Smoking status: Never Smoker  . Smokeless tobacco: Never Used  Substance Use Topics  . Alcohol use: No    Frequency: Never  . Drug use: Not on file    ALLERGIES:  is allergic to rosuvastatin and sulfa antibiotics.  MEDICATIONS:  Current Outpatient Medications  Medication Sig Dispense Refill  . alendronate (FOSAMAX) 70 MG tablet TAKE 1 TABLET BY MOUTH EVERY 7 DAYS. TAKE WITH A FULL GLASS OF WATER AND DO NOT LIE DOWN FOR THE NEXT 30 MINUTES.    Marland Kitchen  amLODipine (NORVASC) 5 MG tablet Take 1 tablet by mouth daily.     Marland Kitchen aspirin EC 81 MG tablet Take 81 mg by mouth daily.     . Calcium-Vitamin D 600-200 MG-UNIT per tablet Take 1 tablet by mouth daily.     . Cholecalciferol 1000 units tablet Take 1,000 Units by mouth daily.    Marland Kitchen ezetimibe (ZETIA) 10 MG tablet Take 10 mg by mouth daily.     . ferrous sulfate 325 (65 FE) MG EC tablet Take 325 mg by mouth daily with breakfast.     . folic acid (FOLVITE) 1 MG tablet Take 1 mg by mouth daily.     Marland Kitchen losartan (COZAAR) 100  MG tablet Take 100 mg by mouth daily.    . methotrexate (RHEUMATREX) 2.5 MG tablet Take 2.5 mg by mouth once a week.     . Multiple Vitamin (MULTI-VITAMINS) TABS Take by mouth.    . simvastatin (ZOCOR) 20 MG tablet Take 20 mg by mouth daily.      No current facility-administered medications for this visit.    Facility-Administered Medications Ordered in Other Visits  Medication Dose Route Frequency Provider Last Rate Last Dose  . peginterferon alfa-2b (SYLATRON) injection 198 mcg  3 mcg/kg Subcutaneous Once Choksi, Delorise Shiner, MD      . peginterferon alfa-2b (SYLATRON) injection 198 mcg  3 mcg/kg Subcutaneous Once Forest Gleason, MD        PHYSICAL EXAMINATION: ECOG PERFORMANCE STATUS: 0 - Asymptomatic  BP (!) 163/82 (BP Location: Left Arm, Patient Position: Sitting)   Pulse 86   Temp 97.9 F (36.6 C) (Tympanic)   Resp 16   Wt 168 lb (76.2 kg)   BMI 30.73 kg/m   Filed Weights   05/19/18 1110 05/19/18 1111  Weight: 168 lb 14 oz (76.6 kg) 168 lb (76.2 kg)    GENERAL: Well-nourished well-developed; Alert, no distress and comfortable.  Alone. EYES: no pallor or icterus OROPHARYNX: no thrush or ulceration; NECK: supple; no lymph nodes felt. LYMPH:  no palpable lymphadenopathy in the axillary or inguinal regions LUNGS: Decreased breath sounds auscultation bilaterally. No wheeze or crackles HEART/CVS: regular rate & rhythm and no murmurs; No lower extremity edema ABDOMEN:abdomen soft, non-tender and normal bowel sounds. No hepatomegaly or splenomegaly.  Musculoskeletal:no cyanosis of digits and no clubbing  PSYCH: alert & oriented x 3 with fluent speech NEURO: no focal motor/sensory deficits SKIN: Nodules of neurofibromatosis present; right mastectomy noted no lumps or bumps.    LABORATORY DATA:  I have reviewed the data as listed    Component Value Date/Time   NA 139 05/14/2018 0925   NA 141 03/02/2015 0952   K 3.8 05/14/2018 0925   K 3.9 03/02/2015 0952   CL 103 05/14/2018  0925   CL 106 03/02/2015 0952   CO2 24 05/14/2018 0925   CO2 28 03/02/2015 0952   GLUCOSE 131 (H) 05/14/2018 0925   GLUCOSE 97 03/02/2015 0952   BUN 18 05/14/2018 0925   BUN 10 03/02/2015 0952   CREATININE 0.78 05/14/2018 0925   CREATININE 0.57 03/02/2015 0952   CALCIUM 9.0 05/14/2018 0925   CALCIUM 9.1 03/02/2015 0952   PROT 7.6 05/14/2018 0925   PROT 7.4 03/02/2015 0952   ALBUMIN 4.3 05/14/2018 0925   ALBUMIN 4.2 03/02/2015 0952   AST 15 05/14/2018 0925   AST 24 03/02/2015 0952   ALT 13 05/14/2018 0925   ALT 21 03/02/2015 0952   ALKPHOS 47 05/14/2018 0925   ALKPHOS 41 03/02/2015  0952   BILITOT 0.6 05/14/2018 0925   BILITOT 0.8 03/02/2015 0952   GFRNONAA >60 05/14/2018 0925   GFRNONAA >60 03/02/2015 0952   GFRAA >60 05/14/2018 0925   GFRAA >60 03/02/2015 0952    No results found for: SPEP, UPEP  Lab Results  Component Value Date   WBC 6.3 05/14/2018   NEUTROABS 4.1 05/14/2018   HGB 12.4 05/14/2018   HCT 38.3 05/14/2018   MCV 81.5 05/14/2018   PLT 197 05/14/2018      Chemistry      Component Value Date/Time   NA 139 05/14/2018 0925   NA 141 03/02/2015 0952   K 3.8 05/14/2018 0925   K 3.9 03/02/2015 0952   CL 103 05/14/2018 0925   CL 106 03/02/2015 0952   CO2 24 05/14/2018 0925   CO2 28 03/02/2015 0952   BUN 18 05/14/2018 0925   BUN 10 03/02/2015 0952   CREATININE 0.78 05/14/2018 0925   CREATININE 0.57 03/02/2015 0952      Component Value Date/Time   CALCIUM 9.0 05/14/2018 0925   CALCIUM 9.1 03/02/2015 0952   ALKPHOS 47 05/14/2018 0925   ALKPHOS 41 03/02/2015 0952   AST 15 05/14/2018 0925   AST 24 03/02/2015 0952   ALT 13 05/14/2018 0925   ALT 21 03/02/2015 0952   BILITOT 0.6 05/14/2018 0925   BILITOT 0.8 03/02/2015 0952       RADIOGRAPHIC STUDIES: I have personally reviewed the radiological images as listed and agreed with the findings in the report. No results found.   ASSESSMENT & PLAN:  [name] ([place]) Melanoma/ right breast-  status post mastectomty followed by radiation followed by adjuvant pegylated interferon for one year [finished August 2016].   # STABLE; CT-chest and pelvis scan- NED.  clinically no evidence of disease recurrence.  Labs - Normal.  Left breast mammogram January 2019 normal.  # Elevated [id number] systolic; recommend checking blood pressure at home on a regular basis; taking a log to PCP.  # follow up in 6 months/labs; No imaging.   # I reviewed the blood work- with the patient in detail; also reviewed the imaging independently [as summarized above]; and with the patient in detail.   # 25 minutes face-to-face with the patient discussing the above plan of care; more than 50% of time spent on prognosis/ natural history; counseling and coordination.     No orders of the defined types were placed in this encounter.  All questions were answered. The patient knows to call the clinic with any problems, questions or concerns.      Cammie Sickle, MD 05/19/2018 12:55 PM

## 2018-05-19 NOTE — Assessment & Plan Note (Addendum)
Melanoma/ right breast- status post mastectomty followed by radiation followed by adjuvant pegylated interferon for one year [finished August 2016].   # STABLE; CT-chest and pelvis scan- NED.  clinically no evidence of disease recurrence.  Labs - Normal.  Left breast mammogram January 2019 normal.  # Elevated [id number] systolic; recommend checking blood pressure at home on a regular basis; taking a log to PCP.  # follow up in 6 months/labs; No imaging.   # I reviewed the blood work- with the patient in detail; also reviewed the imaging independently [as summarized above]; and with the patient in detail.   # 25 minutes face-to-face with the patient discussing the above plan of care; more than 50% of time spent on prognosis/ natural history; counseling and coordination.

## 2018-11-02 ENCOUNTER — Other Ambulatory Visit: Payer: Self-pay | Admitting: Internal Medicine

## 2018-11-02 DIAGNOSIS — Z1231 Encounter for screening mammogram for malignant neoplasm of breast: Secondary | ICD-10-CM

## 2018-11-05 ENCOUNTER — Other Ambulatory Visit: Payer: Self-pay

## 2018-11-05 DIAGNOSIS — D0352 Melanoma in situ of breast (skin) (soft tissue): Secondary | ICD-10-CM

## 2018-11-12 ENCOUNTER — Inpatient Hospital Stay: Payer: Medicare Other | Attending: Internal Medicine

## 2018-11-12 DIAGNOSIS — Z79899 Other long term (current) drug therapy: Secondary | ICD-10-CM | POA: Insufficient documentation

## 2018-11-12 DIAGNOSIS — M069 Rheumatoid arthritis, unspecified: Secondary | ICD-10-CM | POA: Insufficient documentation

## 2018-11-12 DIAGNOSIS — D649 Anemia, unspecified: Secondary | ICD-10-CM | POA: Insufficient documentation

## 2018-11-12 DIAGNOSIS — Z923 Personal history of irradiation: Secondary | ICD-10-CM | POA: Diagnosis not present

## 2018-11-12 DIAGNOSIS — I10 Essential (primary) hypertension: Secondary | ICD-10-CM | POA: Diagnosis not present

## 2018-11-12 DIAGNOSIS — Z85828 Personal history of other malignant neoplasm of skin: Secondary | ICD-10-CM | POA: Insufficient documentation

## 2018-11-12 DIAGNOSIS — Z7982 Long term (current) use of aspirin: Secondary | ICD-10-CM | POA: Diagnosis not present

## 2018-11-12 DIAGNOSIS — D0352 Melanoma in situ of breast (skin) (soft tissue): Secondary | ICD-10-CM

## 2018-11-12 LAB — CBC WITH DIFFERENTIAL/PLATELET
Abs Immature Granulocytes: 0.09 10*3/uL — ABNORMAL HIGH (ref 0.00–0.07)
BASOS PCT: 0 %
Basophils Absolute: 0 10*3/uL (ref 0.0–0.1)
EOS ABS: 0.1 10*3/uL (ref 0.0–0.5)
EOS PCT: 2 %
HCT: 37 % (ref 36.0–46.0)
Hemoglobin: 11.7 g/dL — ABNORMAL LOW (ref 12.0–15.0)
Immature Granulocytes: 1 %
Lymphocytes Relative: 15 %
Lymphs Abs: 1 10*3/uL (ref 0.7–4.0)
MCH: 26.1 pg (ref 26.0–34.0)
MCHC: 31.6 g/dL (ref 30.0–36.0)
MCV: 82.4 fL (ref 80.0–100.0)
MONO ABS: 0.6 10*3/uL (ref 0.1–1.0)
Monocytes Relative: 9 %
NRBC: 0 % (ref 0.0–0.2)
Neutro Abs: 4.9 10*3/uL (ref 1.7–7.7)
Neutrophils Relative %: 73 %
PLATELETS: 229 10*3/uL (ref 150–400)
RBC: 4.49 MIL/uL (ref 3.87–5.11)
RDW: 15.7 % — AB (ref 11.5–15.5)
WBC: 6.8 10*3/uL (ref 4.0–10.5)

## 2018-11-12 LAB — COMPREHENSIVE METABOLIC PANEL
ALT: 10 U/L (ref 0–44)
AST: 14 U/L — AB (ref 15–41)
Albumin: 4.1 g/dL (ref 3.5–5.0)
Alkaline Phosphatase: 52 U/L (ref 38–126)
Anion gap: 8 (ref 5–15)
BILIRUBIN TOTAL: 0.7 mg/dL (ref 0.3–1.2)
BUN: 14 mg/dL (ref 8–23)
CO2: 27 mmol/L (ref 22–32)
CREATININE: 0.72 mg/dL (ref 0.44–1.00)
Calcium: 9.4 mg/dL (ref 8.9–10.3)
Chloride: 102 mmol/L (ref 98–111)
GFR calc non Af Amer: >60 mL/min (ref >60)
Glucose, Bld: 121 mg/dL — ABNORMAL HIGH (ref 70–99)
Potassium: 4.2 mmol/L (ref 3.5–5.1)
Sodium: 137 mmol/L (ref 135–145)
TOTAL PROTEIN: 7.7 g/dL (ref 6.5–8.1)

## 2018-11-12 LAB — LACTATE DEHYDROGENASE: LDH: 111 U/L (ref 98–192)

## 2018-11-16 ENCOUNTER — Other Ambulatory Visit: Payer: Self-pay

## 2018-11-16 DIAGNOSIS — D0352 Melanoma in situ of breast (skin) (soft tissue): Secondary | ICD-10-CM

## 2018-11-17 ENCOUNTER — Encounter: Payer: Self-pay | Admitting: Internal Medicine

## 2018-11-17 ENCOUNTER — Inpatient Hospital Stay (HOSPITAL_BASED_OUTPATIENT_CLINIC_OR_DEPARTMENT_OTHER): Payer: Medicare Other | Admitting: Internal Medicine

## 2018-11-17 VITALS — BP 136/76 | HR 82 | Temp 97.8°F | Wt 173.0 lb

## 2018-11-17 DIAGNOSIS — M069 Rheumatoid arthritis, unspecified: Secondary | ICD-10-CM

## 2018-11-17 DIAGNOSIS — Z85820 Personal history of malignant melanoma of skin: Secondary | ICD-10-CM

## 2018-11-17 DIAGNOSIS — D649 Anemia, unspecified: Secondary | ICD-10-CM | POA: Diagnosis not present

## 2018-11-17 DIAGNOSIS — Z9011 Acquired absence of right breast and nipple: Secondary | ICD-10-CM

## 2018-11-17 DIAGNOSIS — C439 Malignant melanoma of skin, unspecified: Secondary | ICD-10-CM

## 2018-11-17 DIAGNOSIS — Z923 Personal history of irradiation: Secondary | ICD-10-CM

## 2018-11-17 DIAGNOSIS — I10 Essential (primary) hypertension: Secondary | ICD-10-CM | POA: Diagnosis not present

## 2018-11-17 DIAGNOSIS — Z85828 Personal history of other malignant neoplasm of skin: Secondary | ICD-10-CM | POA: Diagnosis not present

## 2018-11-17 DIAGNOSIS — Z9221 Personal history of antineoplastic chemotherapy: Secondary | ICD-10-CM

## 2018-11-17 NOTE — Addendum Note (Signed)
Addended by: Sandria Bales B on: 11/17/2018 02:54 PM   Modules accepted: Orders

## 2018-11-17 NOTE — Assessment & Plan Note (Addendum)
Melanoma/ right breast- status post mastectomty followed by radiation followed by adjuvant pegylated interferon for one year [finished August 2016].  July 2019 CT scan-NED.   #Clinically no evidence of recurrence/continue surveillance will repeat imaging again in 6 months.  Discussed that there after on clinical basis.  #Mild anemia hemoglobin 11.7; will check iron studies ferritin today.  Patient awaiting repeat colonoscopy screening end of this year.  # Elevated BP-better controlled today.  Stable.  # DISPOSITION: # follow up in 6 months-Dr.Corcoran/labs-cbc/cmp/LDH; CT C/A/P prior-  # 25 minutes face-to-face with the patient discussing the above plan of care; more than 50% of time spent on prognosis/ natural history; counseling and coordination.

## 2018-11-17 NOTE — Progress Notes (Signed)
[place] OFFICE PROGRESS NOTE  Patient Care Team: Glendon Axe, MD as PCP - General (Internal Medicine)  Cancer Staging No matching staging information was found for the patient.   Oncology History   # June 2015- STAGE II- RIGHT BREAST SKIN MELANOMA- [ Dr. 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    # Rheumatoid arthritis on methotrexate and Enbrel; hypertension and NEUROFIBROMATOSIS  # MOLECULAR-  BRAF V600E negative; colo- Dr.Elliot [2015]     [name] ([place])      INTERVAL HISTORY:  Kristina Pittman 73 y.o.  female pleasant patient above history of stage II melanoma status post resection followed by adjuvant therapy is here for follow-up.  Patient denies any lumps or bumps.  Appetite is good.  Denies weight loss.  No nausea no vomiting.  No headaches headaches.   Denies any blood in stools or black or stools.  Review of Systems  Constitutional: Negative for chills, diaphoresis, fever, malaise/fatigue and weight loss.  HENT: Negative for nosebleeds and sore throat.   Eyes: Negative for double vision.  Respiratory: Negative for cough, hemoptysis, sputum production, shortness of breath and wheezing.   Cardiovascular: Negative for chest pain, palpitations, orthopnea and leg swelling.  Gastrointestinal: Negative for abdominal pain, blood in stool, constipation, diarrhea, heartburn, melena, nausea and vomiting.  Genitourinary: Negative for dysuria, frequency and urgency.  Musculoskeletal: Negative for back pain and joint pain.  Skin: Negative.  Negative for itching and rash.  Neurological: Negative for dizziness, tingling,  focal weakness, weakness and headaches.  Endo/Heme/Allergies: Does not bruise/bleed easily.  Psychiatric/Behavioral: Negative for depression. The patient is not nervous/anxious and does not have insomnia.       PAST MEDICAL HISTORY :  Past Medical History:  Diagnosis Date  . Arthritis   . Breast cancer ([place])   . Collagen vascular disease ([facility])    RA  . Depression   . Hypercholesterolemia   . Hyperlipidemia   . Hypertension   . Iron deficiency anemia   . Iron deficiency anemia   . Neurofibromatosis ([place])   . Osteoporosis   . Personal history of chemotherapy   . Personal history of radiation therapy   . Skin cancer    melanoma    PAST SURGICAL HISTORY :   Past Surgical History:  Procedure Laterality Date  . ABDOMINAL HYSTERECTOMY    . Excision  right breast mass    . MASTECTOMY Right 2015    FAMILY HISTORY :   Family History  Problem Relation Age of Onset  . Breast cancer Neg Hx     SOCIAL HISTORY:   Social History   Tobacco Use  . Smoking status: Never Smoker  . Smokeless tobacco: Never Used  Substance Use Topics  . Alcohol use: No    Frequency: Never  . Drug use: Not on file    ALLERGIES:  is allergic to rosuvastatin and sulfa antibiotics.  MEDICATIONS:  Current Outpatient Medications  Medication Sig Dispense Refill  . alendronate (FOSAMAX) 70 MG tablet TAKE 1 TABLET BY MOUTH EVERY 7 DAYS. TAKE WITH A FULL GLASS OF WATER AND DO NOT LIE DOWN FOR THE NEXT 30  MINUTES.    Marland Kitchen aspirin EC 81 MG tablet Take 81 mg by mouth daily.     . Calcium-Vitamin D 600-200 MG-UNIT per tablet Take 1 tablet by mouth daily.     . Cholecalciferol 1000 units tablet Take 1,000 Units by mouth daily.    Marland Kitchen ezetimibe (ZETIA) 10 MG tablet Take 10 mg by mouth daily.     . ferrous sulfate 325 (65 FE) MG EC tablet Take 325 mg by mouth daily with breakfast.     . folic acid (FOLVITE) 1 MG tablet Take 1 mg by mouth daily.     Marland Kitchen losartan (COZAAR) 100 MG tablet Take 100 mg by mouth daily.     . methotrexate (RHEUMATREX) 2.5 MG tablet Take 2.5 mg by mouth once a week.     . Multiple Vitamin (MULTI-VITAMINS) TABS Take by mouth.    . simvastatin (ZOCOR) 20 MG tablet Take 20 mg by mouth daily.     Marland Kitchen spironolactone (ALDACTONE) 50 MG tablet Take by mouth.    Marland Kitchen tiZANidine (ZANAFLEX) 2 MG tablet Take by mouth.    Marland Kitchen amLODipine (NORVASC) 5 MG tablet Take 1 tablet by mouth daily.      No current facility-administered medications for this visit.    Facility-Administered Medications Ordered in Other Visits  Medication Dose Route Frequency Provider Last Rate Last Dose  . peginterferon alfa-2b (SYLATRON) injection 198 mcg  3 mcg/kg Subcutaneous Once Choksi, Delorise Shiner, MD      . peginterferon alfa-2b (SYLATRON) injection 198 mcg  3 mcg/kg Subcutaneous Once Choksi, Delorise Shiner, MD        PHYSICAL EXAMINATION: ECOG PERFORMANCE STATUS: 0 - Asymptomatic  BP 136/76 (BP Location: Left Arm, Patient Position: Sitting, Cuff Size: Normal)   Pulse 82   Temp 97.8 F (36.6 C) (Tympanic)   Wt 172 lb 15.2 oz (78.4 kg)   BMI 31.63 kg/m   Filed Weights   11/17/18 1054  Weight: 172 lb 15.2 oz (78.4 kg)    Physical Exam  Constitutional: She is oriented to person, place, and time and well-developed, well-nourished, and in no distress.  HENT:  Head: Normocephalic and atraumatic.  Mouth/Throat: Oropharynx is clear and moist. No oropharyngeal exudate.  Eyes: Pupils are equal, round, and reactive to light.  Neck: Normal range of motion. Neck supple.  Cardiovascular: Normal rate and regular rhythm.  Pulmonary/Chest: No respiratory distress. She has no wheezes.  Abdominal: Soft. Bowel sounds are normal. She exhibits no distension and no mass. There is no abdominal tenderness. There is no rebound and no guarding.  Musculoskeletal: Normal range of motion.        General: No tenderness or edema.  Neurological: She is alert and oriented to person, place, and time.  Skin: Skin is warm.   Nodules of  neurofibromatosis present; right mastectomy noted no lumps or bumps.   Psychiatric: Affect normal.       LABORATORY DATA:  I have reviewed the data as listed    Component Value Date/Time   NA 137 11/12/2018 1040   NA 141 03/02/2015 0952   K 4.2 11/12/2018 1040   K 3.9 03/02/2015 0952   CL 102 11/12/2018 1040   CL 106 03/02/2015 0952   CO2 27 11/12/2018 1040   CO2 28 03/02/2015 0952   GLUCOSE 121 (H) 11/12/2018 1040   GLUCOSE 97 03/02/2015 0952   BUN 14 11/12/2018 1040   BUN 10 03/02/2015 0952   CREATININE 0.72 11/12/2018 1040   CREATININE 0.57 03/02/2015 4888  CALCIUM 9.4 11/12/2018 1040   CALCIUM 9.1 03/02/2015 0952   PROT 7.7 11/12/2018 1040   PROT 7.4 03/02/2015 0952   ALBUMIN 4.1 11/12/2018 1040   ALBUMIN 4.2 03/02/2015 0952   AST 14 (L) 11/12/2018 1040   AST 24 03/02/2015 0952   ALT 10 11/12/2018 1040   ALT 21 03/02/2015 0952   ALKPHOS 52 11/12/2018 1040   ALKPHOS 41 03/02/2015 0952   BILITOT 0.7 11/12/2018 1040   BILITOT 0.8 03/02/2015 0952   GFRNONAA >60 11/12/2018 1040   GFRNONAA >60 03/02/2015 0952   GFRAA >60 11/12/2018 1040   GFRAA >60 03/02/2015 0952    No results found for: SPEP, UPEP  Lab Results  Component Value Date   WBC 6.8 11/12/2018   NEUTROABS 4.9 11/12/2018   HGB 11.7 (L) 11/12/2018   HCT 37.0 11/12/2018   MCV 82.4 11/12/2018   PLT 229 11/12/2018      Chemistry      Component Value Date/Time   NA 137 11/12/2018 1040   NA 141 03/02/2015 0952   K 4.2 11/12/2018 1040   K 3.9 03/02/2015 0952   CL 102 11/12/2018 1040   CL 106 03/02/2015 0952   CO2 27 11/12/2018 1040   CO2 28 03/02/2015 0952   BUN 14 11/12/2018 1040   BUN 10 03/02/2015 0952   CREATININE 0.72 11/12/2018 1040   CREATININE 0.57 03/02/2015 0952      Component Value Date/Time   CALCIUM 9.4 11/12/2018 1040   CALCIUM 9.1 03/02/2015 0952   ALKPHOS 52 11/12/2018 1040   ALKPHOS 41 03/02/2015 0952   AST 14 (L) 11/12/2018 1040   AST 24 03/02/2015 0952   ALT 10  11/12/2018 1040   ALT 21 03/02/2015 0952   BILITOT 0.7 11/12/2018 1040   BILITOT 0.8 03/02/2015 0952       RADIOGRAPHIC STUDIES: I have personally reviewed the radiological images as listed and agreed with the findings in the report. No results found.   ASSESSMENT & PLAN:  [name] ([place]) Melanoma/ right breast- status post mastectomty followed by radiation followed by adjuvant pegylated interferon for one year [finished August 2016].  July 2019 CT scan-NED.   #Clinically no evidence of recurrence/continue surveillance will repeat imaging again in 6 months.  Discussed that there after on clinical basis.  #Mild anemia hemoglobin 11.7; will check iron studies ferritin today.  Patient awaiting repeat colonoscopy screening end of this year.  # Elevated BP-better controlled today.  Stable.  # DISPOSITION: # follow up in 6 months-Dr.Corcoran/labs-cbc/cmp/LDH; CT C/A/P prior-  # 25 minutes face-to-face with the patient discussing the above plan of care; more than 50% of time spent on prognosis/ natural history; counseling and coordination.     Orders Placed This Encounter  Procedures  . CT CHEST W CONTRAST    Standing Status:   Future    Standing Expiration Date:   11/18/2019    Order Specific Question:   If indicated for the ordered procedure, I authorize the administration of contrast media per Radiology protocol    Answer:   Yes    Order Specific Question:   Preferred imaging location?    Answer:   [facility]    Order Specific Question:   Radiology Contrast Protocol - do NOT remove file path    Answer:   \\[email or web address]    Order Specific Question:   ** REASON FOR EXAM (FREE TEXT)    Answer:   melanoma  . CT Abdomen Pelvis W Contrast  Standing Status:   Future    Standing Expiration Date:   11/17/2019    Order Specific Question:   ** REASON FOR EXAM (FREE TEXT)    Answer:   melanoma    Order Specific Question:   If indicated for the  ordered procedure, I authorize the administration of contrast media per Radiology protocol    Answer:   Yes    Order Specific Question:   Preferred imaging location?    Answer[facility]    Order Specific Question:   Is Oral Contrast requested for this exam?    Answer:   Yes, Per Radiology protocol    Order Specific Question:   Radiology Contrast Protocol - do NOT remove file path    Answer:   \\[email or web address]   All questions were answered. The patient knows to call the clinic with any problems, questions or concerns.      Cammie Sickle, MD 11/17/2018 11:13 AM

## 2018-12-09 ENCOUNTER — Ambulatory Visit
Admission: RE | Admit: 2018-12-09 | Discharge: 2018-12-09 | Disposition: A | Discharge status: Home / Self Care | Payer: Medicare Other | Source: Ambulatory Visit | Attending: Internal Medicine | Admitting: Internal Medicine

## 2018-12-09 DIAGNOSIS — Z1231 Encounter for screening mammogram for malignant neoplasm of breast: Secondary | ICD-10-CM | POA: Insufficient documentation

## 2019-05-06 ENCOUNTER — Other Ambulatory Visit: Payer: Self-pay

## 2019-05-10 ENCOUNTER — Inpatient Hospital Stay: Payer: Medicare Other | Attending: Hematology and Oncology

## 2019-05-10 ENCOUNTER — Other Ambulatory Visit: Payer: Self-pay

## 2019-05-10 ENCOUNTER — Ambulatory Visit
Admission: RE | Admit: 2019-05-10 | Discharge: 2019-05-10 | Disposition: A | Discharge status: Home / Self Care | Payer: Medicare Other | Source: Ambulatory Visit | Attending: Internal Medicine | Admitting: Internal Medicine

## 2019-05-10 DIAGNOSIS — Z9011 Acquired absence of right breast and nipple: Secondary | ICD-10-CM | POA: Insufficient documentation

## 2019-05-10 DIAGNOSIS — Z923 Personal history of irradiation: Secondary | ICD-10-CM | POA: Diagnosis not present

## 2019-05-10 DIAGNOSIS — C439 Malignant melanoma of skin, unspecified: Secondary | ICD-10-CM

## 2019-05-10 DIAGNOSIS — Z9221 Personal history of antineoplastic chemotherapy: Secondary | ICD-10-CM | POA: Diagnosis not present

## 2019-05-10 DIAGNOSIS — Z85820 Personal history of malignant melanoma of skin: Secondary | ICD-10-CM | POA: Insufficient documentation

## 2019-05-10 DIAGNOSIS — M069 Rheumatoid arthritis, unspecified: Secondary | ICD-10-CM | POA: Insufficient documentation

## 2019-05-10 LAB — CBC WITH DIFFERENTIAL/PLATELET
Abs Immature Granulocytes: 0.03 10*3/uL (ref 0.00–0.07)
Basophils Absolute: 0 10*3/uL (ref 0.0–0.1)
Basophils Relative: 0 %
Eosinophils Absolute: 0.1 10*3/uL (ref 0.0–0.5)
Eosinophils Relative: 1 %
HCT: 38.4 % (ref 36.0–46.0)
Hemoglobin: 12.4 g/dL (ref 12.0–15.0)
Immature Granulocytes: 1 %
Lymphocytes Relative: 17 %
Lymphs Abs: 1 10*3/uL (ref 0.7–4.0)
MCH: 26.6 pg (ref 26.0–34.0)
MCHC: 32.3 g/dL (ref 30.0–36.0)
MCV: 82.4 fL (ref 80.0–100.0)
Monocytes Absolute: 0.5 10*3/uL (ref 0.1–1.0)
Monocytes Relative: 8 %
Neutro Abs: 4.3 10*3/uL (ref 1.7–7.7)
Neutrophils Relative %: 73 %
Platelets: 232 10*3/uL (ref 150–400)
RBC: 4.66 MIL/uL (ref 3.87–5.11)
RDW: 16.4 % — ABNORMAL HIGH (ref 11.5–15.5)
WBC: 5.9 10*3/uL (ref 4.0–10.5)
nRBC: 0 % (ref 0.0–0.2)

## 2019-05-10 LAB — LACTATE DEHYDROGENASE: LDH: 116 U/L (ref 98–192)

## 2019-05-10 LAB — COMPREHENSIVE METABOLIC PANEL
ALT: 24 U/L (ref 0–44)
AST: 21 U/L (ref 15–41)
Albumin: 4.3 g/dL (ref 3.5–5.0)
Alkaline Phosphatase: 47 U/L (ref 38–126)
Anion gap: 11 (ref 5–15)
BUN: 13 mg/dL (ref 8–23)
CO2: 24 mmol/L (ref 22–32)
Calcium: 9.5 mg/dL (ref 8.9–10.3)
Chloride: 98 mmol/L (ref 98–111)
Creatinine, Ser: 0.76 mg/dL (ref 0.44–1.00)
GFR calc Af Amer: >60 mL/min (ref >60)
GFR calc non Af Amer: >60 mL/min (ref >60)
Glucose, Bld: 125 mg/dL — ABNORMAL HIGH (ref 70–99)
Potassium: 3.7 mmol/L (ref 3.5–5.1)
Sodium: 133 mmol/L — ABNORMAL LOW (ref 135–145)
Total Bilirubin: 0.5 mg/dL (ref 0.3–1.2)
Total Protein: 7.8 g/dL (ref 6.5–8.1)

## 2019-05-10 MED ORDER — IOHEXOL 300 MG/ML  SOLN
100.0000 mL | Freq: Once | INTRAMUSCULAR | Status: AC | PRN
  Administered 2019-05-10: 10:00:00 100 mL via INTRAVENOUS

## 2019-05-17 NOTE — Progress Notes (Signed)
[facility]  [address] [place], [place] [address] Phone: [phone]  Fax: [phone]   Clinic Day:  05/18/2019  Referring physician: Glendon Axe, MD  Chief Complaint: Kristina Pittman is a 73 y.o. female with a history of stage IIC melanoma s/p resection and adjuvant therapy who is seen for new patient assessment.   HPI:  The patient has neurofibromatosis.  She had a small mole like lesion over the inferior portion of her right breast for years.  Biopsy in 2013 revealed a neurofibroma.  The lesion then began to increase in size and ulcerate.  She underwent excision on 02/18/2014 by Dr Rochel Brome.  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  There was ulceration present.  Invasive melanoma involved the peripheral and dep margins.  Clark level was V.  There was 12 mitosis per mm2.  There were tumor infiltrating lymphocytes.  There was no lymphvascular or perineural invasion.  Pathologic stage was pT4bNxMx.  PET scan on 03/17/2014 revealed no residual malignancy or metastatic disease.  Head MRI on 03/22/2014 revealed mild atrophy with mild to moderate small vessel disease.  There were no acute intracranial findings.   There was a tiny focus of chronic hemorrhage suspected in the left anterior frontal parasagittal subcortical white matter felt most likely to represent chronic hemorrhage.  The patient underwent modified right radical mastectomy and lymph node dissection on 03/24/2014.  Pathology revealed no residual melanoma.  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  There were 17 lymph nodes negative for malignancy.  BRAF V600E was negative.  Final pathologic stage was pT4bN0.  She received right chest wall and peripheral lymphatic radiation by Dr Baruch Gouty.  She was treated with peg-interferon alfa-2b (sylatron).  Treatment discontinued in 06/2015. She reported low energy and the food had a bed taste when getting her injections.  She  was initially treated by Dr Oliva Bustard then Dr Rogue Bussing.  Notes reviewed.  She has undergone yearly PET scans (11/09/2014, 04/23/2016, 05/15/2017) and CT scans (05/14/2018, 05/10/2019) with no evidence of recurrent disease.  The patient was last seen in the medical oncology clinic by Dr. Rogue Bussing on 11/17/2018. At that time, she was doing well.  Left mammogram on 12/09/2018 revealed no malignancy. Chest, abdomen, and pelvis CT on 05/10/2019 revealed no findings to suggest metastatic disease.  Labs on 05/10/2019 revealed a hematocrit 38.4, hemoglobin 12.4, platelets 232,000, WBC 5,900 with an ANC 4,300. LDH was 116.  Symptomatically, the patient states "I'm fine". She notes getting a yearly physical, but no doctor is checking her skin regularly. She denies any symptoms. She has not noticed any skin changes. She denies any skin concerns. She examens her breast monthly.  Her weight is up 3 lbs. She reports no pain, but she notes having arthritis.  She reports that she is due for another colonoscopy in 2021.   The patient has rheumatoid arthritis and is on methotrexate and Enbrel which is being followed by Dr. Jefm Bryant.    Past Medical History:  Diagnosis Date  . Arthritis   . Breast cancer ([place])   . Collagen vascular disease (H[facility])    RA  . Depression   . Hypercholesterolemia   . Hyperlipidemia   . Hypertension   . Iron deficiency anemia   . Iron deficiency anemia   . Neurofibromatosis ([place])   . Osteoporosis   . Personal history of chemotherapy   . Personal history of radiation therapy   . Skin cancer    melanoma  Past Surgical History:  Procedure Laterality Date  . ABDOMINAL HYSTERECTOMY    . Excision  right breast mass    . MASTECTOMY Right 2015    Family History  Problem Relation Age of Onset  . Breast cancer Neg Hx     Social History:  reports that she has never smoked. She has never used smokeless tobacco. She reports that she does not drink alcohol. No history on  file for drug. She is retired from the [employer] after 27.5 years. She has no children. She denies any exposure to any toxins and radiation. The patient lives in [place].  The patient is alone today.  Allergies:  Allergies  Allergen Reactions  . Rosuvastatin Nausea And Vomiting and Other (See Comments)  . Sulfa Antibiotics     Other reaction(s): Unknown    Current Medications: Current Outpatient Medications  Medication Sig Dispense Refill  . alendronate (FOSAMAX) 70 MG tablet TAKE 1 TABLET BY MOUTH EVERY 7 DAYS. TAKE WITH A FULL GLASS OF WATER AND DO NOT LIE DOWN FOR THE NEXT 30 MINUTES.    Marland Kitchen amLODipine (NORVASC) 2.5 MG tablet TAKE ONE (1) TABLET BY MOUTH ONCE DAILY    . aspirin EC 81 MG tablet Take 81 mg by mouth daily.     . Calcium-Vitamin D 600-200 MG-UNIT per tablet Take 1 tablet by mouth 2 (two) times daily.     Marland Kitchen ezetimibe (ZETIA) 10 MG tablet Take 10 mg by mouth daily.     . ferrous sulfate 325 (65 FE) MG EC tablet Take 325 mg by mouth daily with breakfast.     . folic acid (FOLVITE) 1 MG tablet Take 1 mg by mouth daily.     Marland Kitchen losartan (COZAAR) 100 MG tablet Take 100 mg by mouth daily.    . methotrexate (RHEUMATREX) 2.5 MG tablet Take 12.5 mg by mouth once a week.     . Multiple Vitamin (MULTI-VITAMINS) TABS Take by mouth.    . simvastatin (ZOCOR) 20 MG tablet Take 20 mg by mouth daily.     Marland Kitchen spironolactone (ALDACTONE) 50 MG tablet Take by mouth.    . tizanidine (ZANAFLEX) 2 MG capsule Take 2 mg by mouth as needed for muscle spasms.     No current facility-administered medications for this visit.    Facility-Administered Medications Ordered in Other Visits  Medication Dose Route Frequency Provider Last Rate Last Dose  . peginterferon alfa-2b (SYLATRON) injection 198 mcg  3 mcg/kg Subcutaneous Once Choksi, Delorise Shiner, MD      . peginterferon alfa-2b (SYLATRON) injection 198 mcg  3 mcg/kg Subcutaneous Once Forest Gleason, MD        Review of Systems  Constitutional: Negative for chills,  fever, malaise/fatigue and weight loss (up 3 lbs.).       "I'm fine".  HENT: Negative for congestion, hearing loss and sore throat.   Eyes: Negative for blurred vision.  Respiratory: Negative for cough, hemoptysis and shortness of breath.   Cardiovascular: Negative for chest pain, palpitations and leg swelling.  Gastrointestinal: Negative for abdominal pain, constipation, diarrhea, heartburn, nausea and vomiting.  Genitourinary: Negative for dysuria, hematuria and urgency.  Musculoskeletal: Negative for back pain, joint pain (arthiritis), myalgias and neck pain.  Skin: Negative for rash.  Neurological: Negative for dizziness, tingling, sensory change, weakness and headaches.  Endo/Heme/Allergies: Does not bruise/bleed easily.  Psychiatric/Behavioral: Negative for depression and memory loss. The patient is not nervous/anxious and does not have insomnia.   All other systems reviewed and are negative.  Performance status (ECOG): 0  Vital Signs Blood pressure 127/76, pulse 81, temperature (!) 97.2 F (36.2 C), temperature source Tympanic, resp. rate 18, weight 169 lb 15.6 oz (77.1 kg), SpO2 100 %.  Physical Exam  Constitutional: She is oriented to person, place, and time. She appears well-developed and well-nourished. No distress.  HENT:  Head: Normocephalic and atraumatic.  Mouth/Throat: Oropharynx is clear and moist. No oropharyngeal exudate.  Short curly black and gray hair. Geographic tongue.  Eyes: Pupils are equal, round, and reactive to light. Conjunctivae and EOM are normal. No scleral icterus.  Brown eyes. Mask.  Neck: Normal range of motion. Neck supple.  Cardiovascular: Normal rate, regular rhythm and normal heart sounds.  No murmur heard. Pulmonary/Chest: Breath sounds normal. No respiratory distress.  Left breast fibrocystic changes at 11 o'clock and 1 o'clock; fibrocystic inferiorly in the left breast. s/p right mastectomy without erythema or nodularity.  Abdominal: Soft.  Bowel sounds are normal. There is no abdominal tenderness.  Musculoskeletal: Normal range of motion.        General: No tenderness or edema.     Comments: Ulnar deviations in the right hand.  Lymphadenopathy:    She has no cervical adenopathy.    She has no axillary adenopathy.       Right: No supraclavicular adenopathy present.       Left: No supraclavicular adenopathy present.  Neurological: She is alert and oriented to person, place, and time. She has normal reflexes.  Skin: Skin is warm and dry. She is not diaphoretic.  Psychiatric: She has a normal mood and affect. Her behavior is normal. Judgment and thought content normal.  Nursing note and vitals reviewed.   No visits with results within 3 Day(s) from this visit.  Latest known visit with results is:  Appointment on 05/10/2019  Component Date Value Ref Range Status  . LDH 05/10/2019 116  98 - 192 U/L Final   Performed at [facility], [address], [place], [place] [address]  . Sodium 05/10/2019 133* 135 - 145 mmol/L Final  . Potassium 05/10/2019 3.7  3.5 - 5.1 mmol/L Final  . Chloride 05/10/2019 98  98 - 111 mmol/L Final  . CO2 05/10/2019 24  22 - 32 mmol/L Final  . Glucose, Bld 05/10/2019 125* 70 - 99 mg/dL Final  . BUN 05/10/2019 13  8 - 23 mg/dL Final  . Creatinine, Ser 05/10/2019 0.76  0.44 - 1.00 mg/dL Final  . Calcium 05/10/2019 9.5  8.9 - 10.3 mg/dL Final  . Total Protein 05/10/2019 7.8  6.5 - 8.1 g/dL Final  . Albumin 05/10/2019 4.3  3.5 - 5.0 g/dL Final  . AST 05/10/2019 21  15 - 41 U/L Final  . ALT 05/10/2019 24  0 - 44 U/L Final  . Alkaline Phosphatase 05/10/2019 47  38 - 126 U/L Final  . Total Bilirubin 05/10/2019 0.5  0.3 - 1.2 mg/dL Final  . GFR calc non Af Amer 05/10/2019 >60  >60 mL/min Final  . GFR calc Af Amer 05/10/2019 >60  >60 mL/min Final  . Anion gap 05/10/2019 11  5 - 15 Final   Performed at [facility], [address], [place], [place] [address]  . WBC 05/10/2019 5.9   4.0 - 10.5 K/uL Final  . RBC 05/10/2019 4.66  3.87 - 5.11 MIL/uL Final  . Hemoglobin 05/10/2019 12.4  12.0 - 15.0 g/dL Final  . HCT 05/10/2019 38.4  36.0 - 46.0 % Final  . MCV  05/10/2019 82.4  80.0 - 100.0 fL Final  . MCH 05/10/2019 26.6  26.0 - 34.0 pg Final  . MCHC 05/10/2019 32.3  30.0 - 36.0 g/dL Final  . RDW 05/10/2019 16.4* 11.5 - 15.5 % Final  . Platelets 05/10/2019 232  150 - 400 K/uL Final  . nRBC 05/10/2019 0.0  0.0 - 0.2 % Final  . Neutrophils Relative % 05/10/2019 73  % Final  . Neutro Abs 05/10/2019 4.3  1.7 - 7.7 K/uL Final  . Lymphocytes Relative 05/10/2019 17  % Final  . Lymphs Abs 05/10/2019 1.0  0.7 - 4.0 K/uL Final  . Monocytes Relative 05/10/2019 8  % Final  . Monocytes Absolute 05/10/2019 0.5  0.1 - 1.0 K/uL Final  . Eosinophils Relative 05/10/2019 1  % Final  . Eosinophils Absolute 05/10/2019 0.1  0.0 - 0.5 K/uL Final  . Basophils Relative 05/10/2019 0  % Final  . Basophils Absolute 05/10/2019 0.0  0.0 - 0.1 K/uL Final  . Immature Granulocytes 05/10/2019 1  % Final  . Abs Immature Granulocytes 05/10/2019 0.03  0.00 - 0.07 K/uL Final   Performed at [facility], [address], [place], [place] [address]    Assessment:  Kristina Pittman is a 73 y.o. 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  There was ulceration present.  Invasive melanoma involved the peripheral and dep margins.  Clark level was V.  There was 12 mitosis per mm2.  There were tumor infiltrating lymphocytes.  There was no lymphvascular or perineural invasion.  Pathologic stage was pT4bNxMx.  PET scan on 03/17/2014 revealed no residual malignancy or metastatic disease.  Head MRI on 03/22/2014 revealed no acute intracranial findings.     She underwent modified right radical mastectomy and lymph node dissection on 03/24/2014.  Pathology revealed no residual melanoma.  There was  neurofibroma.  There were 17 lymph nodes negative for malignancy.  BRAF V600E was negative.  Final pathologic stage was pT4bN0.  She received right chest wall and peripheral lymphatic radiation.  She received peg-interferon alfa-2b (completed 06/2015).  She has undergone yearly PET scans (11/09/2014, 04/23/2016, 05/15/2017) and CT scans (05/14/2018, 05/10/2019) with no evidence of recurrent disease.  Chest, abdomen, and pelvis CT on 05/10/2019 revealed no findings to suggest metastatic disease.  Left mammogram on 12/09/2018 revealed no malignancy.   She has rheumatoid arthritis and is on methotrexate and Enbrel.  Symptomatically, she is doing well.  Exam reveals no adenopathy or hepatosplenomegaly.  Plan: 1.   Review labs from 05/14/2019. 2.   Stage II melanoma of the right breast  Review entire medical history, diagnosis and management of melanoma.   Patient has undergone excision, lymph node dissection, radiation, and interferon.  Chest, abdomen, and pelvic CT on 05/10/2019 personally reviewed.  Agree with radiology interpretation.   No evidence of recurrent disease.  Review imaging to screen for asymptomatic recurrence of metastatic disease is not recommended beyond 5 years. 3.   Left mammogram 12/13/2019. 4.   Chest, abdomen and pelvic CT on 05/10/2020. 5.   RTC for MD assessment, labs (CBC with diff, CMP, LDH- draw before CT), and review of imaging  I discussed the assessment and treatment plan with the patient.  The patient was provided an opportunity to ask questions and all were answered.  The patient agreed with the plan and demonstrated an understanding of the instructions.  The  patient was advised to call back if the symptoms worsen or if the condition fails to improve as anticipated.  I provided 21 minutes of face-to-face time during this this encounter and > 50% was spent counseling as documented under my assessment and plan.     C. Mike Gip, MD, PhD    05/18/2019,  11:26 AM  I, Selena Batten, am acting as scribe for [employer]. Mike Gip, MD, PhD.  I,  C. Mike Gip, MD, have reviewed the above documentation for accuracy and completeness, and I agree with the above.

## 2019-05-18 ENCOUNTER — Other Ambulatory Visit: Payer: Self-pay

## 2019-05-18 ENCOUNTER — Ambulatory Visit: Payer: Medicare Other | Admitting: Hematology and Oncology

## 2019-05-18 ENCOUNTER — Inpatient Hospital Stay (HOSPITAL_BASED_OUTPATIENT_CLINIC_OR_DEPARTMENT_OTHER): Payer: Medicare Other | Admitting: Hematology and Oncology

## 2019-05-18 VITALS — BP 127/76 | HR 81 | Temp 97.2°F | Resp 18 | Wt 170.0 lb

## 2019-05-18 DIAGNOSIS — Z9011 Acquired absence of right breast and nipple: Secondary | ICD-10-CM | POA: Diagnosis not present

## 2019-05-18 DIAGNOSIS — Z9221 Personal history of antineoplastic chemotherapy: Secondary | ICD-10-CM | POA: Diagnosis not present

## 2019-05-18 DIAGNOSIS — Z85820 Personal history of malignant melanoma of skin: Secondary | ICD-10-CM

## 2019-05-18 DIAGNOSIS — Z923 Personal history of irradiation: Secondary | ICD-10-CM

## 2019-05-18 DIAGNOSIS — M069 Rheumatoid arthritis, unspecified: Secondary | ICD-10-CM | POA: Diagnosis not present

## 2019-05-18 DIAGNOSIS — C439 Malignant melanoma of skin, unspecified: Secondary | ICD-10-CM

## 2019-05-18 NOTE — Progress Notes (Signed)
Pt here for follow up. Previous Dr. Aletha Halim patient. Denies any concerns.

## 2019-06-03 ENCOUNTER — Ambulatory Visit: Payer: Medicare Other | Attending: Rheumatology | Admitting: Occupational Therapy

## 2019-06-03 ENCOUNTER — Other Ambulatory Visit: Payer: Self-pay

## 2019-06-03 DIAGNOSIS — M25641 Stiffness of right hand, not elsewhere classified: Secondary | ICD-10-CM | POA: Insufficient documentation

## 2019-06-03 NOTE — Patient Instructions (Signed)
Pt to wear anti ulnar deviation splint during day - increase from wearing 30 min at time to 2 hrs at time - few times during day with activities  Check for irritation in webspace of digits - if redness do not disappear in 45 min - do  to advance  Time wearing yet   RD of digits 10 reps  And opposition to all digits 5 reps  1 x day  And joint protection principles - avoid lateral grip  Use palms or forearm to carry , pick up objects

## 2019-06-03 NOTE — Therapy (Signed)
[facility] [address], [place], [address] Phone: [phone]   Fax:  [phone]  Occupational Therapy Evaluation  Patient Details  Name: CHASIDY JANAK MRN: [id number] Date of Birth: 1946-04-23 Referring Provider (OT): Jefm Bryant    Encounter Date: 06/03/2019  OT End of Session - 06/03/19 0940    Visit Number  1    Number of Visits  2    Date for OT Re-Evaluation  06/17/19    OT Start Time  0846    OT Stop Time  0923    OT Time Calculation (min)  37 min    Activity Tolerance  Patient tolerated treatment well    Behavior During Therapy  [facility] for tasks assessed/performed       Past Medical History:  Diagnosis Date  . Arthritis   . Breast cancer ([place])   . Collagen vascular disease (HCC)    RA  . Depression   . Hypercholesterolemia   . Hyperlipidemia   . Hypertension   . Iron deficiency anemia   . Iron deficiency anemia   . Neurofibromatosis ([place])   . Osteoporosis   . Personal history of chemotherapy   . Personal history of radiation therapy   . Skin cancer    melanoma    Past Surgical History:  Procedure Laterality Date  . ABDOMINAL HYSTERECTOMY    . Excision  right breast mass    . MASTECTOMY Right 2015    There were no vitals filed for this visit.  Subjective Assessment - 06/03/19 0933    Subjective   I seen Dr Jefm Bryant - see him in 6 months again - doing good - no pain really in my hands and can use them in most everything - his worried about my R hand fingers going sideways and not able to open them all the way    Pertinent History  RA - pt taking methotrexate - feeling hands staying about the same the last few years - pinkie started first going sideways - but Dr Jefm Bryant felt driffing was little worse than last time    Patient Stated Goals  Do no want my hands to get worse    Currently in Pain?  No/denies        [facility] OT Assessment - 06/03/19 0001      Assessment   Medical  Diagnosis  RA of hands     Referring Provider (OT)  Jefm Bryant     Onset Date/Surgical Date  06/01/18    Hand Dominance  Right    Next MD Visit  --   6 months check up with Dr Jefm Bryant again      [place]   Vocation  Retired    Leisure  doing own house work , little bit of yard work , read newspaper       AROM   Overall AROM Comments  AROM in L hand WNL , flexion R WFL       Strength   Right Hand Grip (lbs)  44    Right Hand Lateral Pinch  9 lbs    Right Hand 3 Point Pinch  7.5 lbs    Left Hand Grip (lbs)  41    Left Hand Lateral Pinch  10 lbs    Left Hand 3 Point Pinch  8 lbs      Right Hand AROM   R  Thumb Opposition to Index  --   WFL    R Index  MCP 0-90  -40 Degrees    R Long  MCP 0-90  -50 Degrees    R Ring  MCP 0-90  -50 Degrees    R Little  MCP 0-90  -50 Degrees           review and hand out provided :  Pt to wear anti ulnar deviation splint during day - increase from wearing 30 min at time to 2 hrs at time - few times during day with activities  Check for irritation in webspace of digits - if redness do not disappear in 45 min - do  to advance  Time wearing yet   RD of digits 10 reps  And opposition to all digits 5 reps  1 x day  And joint protection principles - avoid lateral grip  Use palms or forearm to carry , pick up objects            OT Education - 06/03/19 0939    Education Details  findings of eval , splint wearing and ROM /joint protection    Person(s) Educated  Patient    Methods  Handout;Demonstration;Explanation    Comprehension  Verbalized understanding;Returned demonstration;Verbal cues required          OT Long Term Goals - 06/03/19 0945      OT LONG TERM GOAL #1   Title  Pt to be independent in Homeprogram of wearing splint , doing ROM and joint protection to prevent UD drift from getting worse    Baseline  no knowledge of HEP    Time  2    Period  Weeks    Status  New     Target Date  06/17/19            Plan - 06/03/19 0941    Clinical Impression Statement  Pt refer to OT for evaluation of hands - pt has diagnosis of RA- pt present with ulnar deviation of digits on R hand - 3rd thru 5th the worse and unable to extend digits 3-5th -  flexin WNL - and grip and prehension strenght in normal range for her age - pt fitted with anti ulnar devation splints to wear during day - and provided info on joint protection principles to prevent hands from getting worse - but ed pt that splints is not going to reverse issue - pt to cont at home with homeprogram -and to call if need to follow up - other wise will discharge her in 2 wks    OT Occupational Profile and History  Problem Focused Assessment - Including review of records relating to presenting problem    Occupational performance deficits (Please refer to evaluation for details):  IADL's;ADL's    Body Structure / Function / Physical Skills  ROM;UE functional use    Rehab Potential  Fair    Clinical Decision Making  Limited treatment options, no task modification necessary    Comorbidities Affecting Occupational Performance:  May have comorbidities impacting occupational performance    Modification or Assistance to Complete Evaluation   No modification of tasks or assist necessary to complete eval    OT Frequency  Biweekly    OT Duration  2 weeks    OT Treatment/Interventions  Splinting;Patient/family education;Therapeutic exercise;Self-care/ADL training;Other (comment)   joint protection   Plan  pt to call if need to follow up with me in the next 2 wks  OT Home Exercise Plan  see pt ingtruction    Consulted and Agree with Plan of Care  Patient       Patient will benefit from skilled therapeutic intervention in order to improve the following deficits and impairments:   Body Structure / Function / Physical Skills: ROM, UE functional use       Visit Diagnosis: 1. Stiffness of right hand, not elsewhere  classified       Problem List Patient Active Problem List   Diagnosis Date Noted  . 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 Neurofibromatosis ([place]) 09/21/2015  . Clinical depression 03/30/2015  . HLD (hyperlipidemia) 03/30/2015  . BP (high blood pressure) 03/30/2015  . Anemia, iron deficiency 03/30/2015  . Clinical neurofibromatosis ([place]) 03/30/2015  . [name] ([place]) 03/09/2015  . Arthritis or polyarthritis, rheumatoid ([place]) 08/03/2014  . OP (osteoporosis) 08/03/2014  . Rheumatoid arthritis ([place]) 08/03/2014    Rosalyn Gess OTR/L,CLT 06/03/2019, 9:47 AM  [facility] [address], [place], [address] Phone: [phone]   Fax:  [phone]  Name: TACOYA ALTIZER MRN: [id number] Date of Birth: [birth year]

## 2019-06-05 ENCOUNTER — Encounter: Payer: Self-pay | Admitting: Hematology and Oncology

## 2019-12-13 ENCOUNTER — Ambulatory Visit
Admission: RE | Admit: 2019-12-13 | Discharge: 2019-12-13 | Disposition: A | Discharge status: Home / Self Care | Payer: Medicare Other | Source: Ambulatory Visit | Attending: Hematology and Oncology | Admitting: Hematology and Oncology

## 2019-12-13 ENCOUNTER — Other Ambulatory Visit: Payer: Self-pay

## 2019-12-13 DIAGNOSIS — Z1231 Encounter for screening mammogram for malignant neoplasm of breast: Secondary | ICD-10-CM | POA: Insufficient documentation

## 2019-12-13 DIAGNOSIS — C439 Malignant melanoma of skin, unspecified: Secondary | ICD-10-CM | POA: Diagnosis not present

## 2019-12-27 ENCOUNTER — Ambulatory Visit: Payer: Medicare Other | Attending: Internal Medicine

## 2019-12-27 DIAGNOSIS — Z23 Encounter for immunization: Secondary | ICD-10-CM | POA: Insufficient documentation

## 2019-12-27 NOTE — Progress Notes (Signed)
   Covid-19 Vaccination Clinic  Name:  Kristina Pittman    MRN: [id number] DOB: September 23, 1946  12/27/2019  [name] was observed post Covid-19 immunization for 15 minutes without incidence. She was provided with Vaccine Information Sheet and instruction to access the V-Safe system.   [name] was instructed to call 911 with any severe reactions post vaccine: Marland Kitchen Difficulty breathing  . Swelling of your face and throat  . A fast heartbeat  . A bad rash all over your body  . Dizziness and weakness    Immunizations Administered    Name Date Dose VIS Date Route   Moderna COVID-19 Vaccine 12/27/2019  3:11 PM 0.5 mL 10/05/2019 Intramuscular   Manufacturer: Moderna   Lot: [id number]   [id number]

## 2020-01-25 ENCOUNTER — Ambulatory Visit: Payer: Medicare Other | Attending: Internal Medicine

## 2020-01-25 ENCOUNTER — Ambulatory Visit: Payer: Medicare Other

## 2020-01-25 DIAGNOSIS — Z23 Encounter for immunization: Secondary | ICD-10-CM

## 2020-01-25 NOTE — Progress Notes (Signed)
   Covid-19 Vaccination Clinic  Name:  Kristina Pittman    MRN: [id number] DOB: [birth year]  01/25/2020  Kristina Pittman was observed post Covid-19 immunization for 15 minutes without incident. She was provided with Vaccine Information Sheet and instruction to access the V-Safe system.   Kristina Pittman was instructed to call 911 with any severe reactions post vaccine: Marland Kitchen Difficulty breathing  . Swelling of face and throat  . A fast heartbeat  . A bad rash all over body  . Dizziness and weakness   Immunizations Administered    Name Date Dose VIS Date Route   Moderna COVID-19 Vaccine 01/25/2020  3:11 PM 0.5 mL 10/05/2019 Intramuscular   Manufacturer: Levan Hurst   LotUT:[id number]   [place]:[id number]

## 2020-05-10 ENCOUNTER — Other Ambulatory Visit: Payer: Self-pay | Admitting: Hematology and Oncology

## 2020-05-10 ENCOUNTER — Other Ambulatory Visit: Payer: Self-pay

## 2020-05-10 ENCOUNTER — Ambulatory Visit
Admission: RE | Admit: 2020-05-10 | Discharge: 2020-05-10 | Disposition: A | Discharge status: Home / Self Care | Payer: Medicare Other | Source: Ambulatory Visit | Attending: Hematology and Oncology | Admitting: Hematology and Oncology

## 2020-05-10 ENCOUNTER — Telehealth: Payer: Self-pay | Admitting: Hematology and Oncology

## 2020-05-10 ENCOUNTER — Inpatient Hospital Stay: Payer: Medicare Other | Attending: Hematology and Oncology

## 2020-05-10 DIAGNOSIS — Z9221 Personal history of antineoplastic chemotherapy: Secondary | ICD-10-CM | POA: Diagnosis not present

## 2020-05-10 DIAGNOSIS — Z90710 Acquired absence of both cervix and uterus: Secondary | ICD-10-CM | POA: Insufficient documentation

## 2020-05-10 DIAGNOSIS — D509 Iron deficiency anemia, unspecified: Secondary | ICD-10-CM

## 2020-05-10 DIAGNOSIS — Z85820 Personal history of malignant melanoma of skin: Secondary | ICD-10-CM | POA: Insufficient documentation

## 2020-05-10 DIAGNOSIS — D225 Melanocytic nevi of trunk: Secondary | ICD-10-CM | POA: Insufficient documentation

## 2020-05-10 DIAGNOSIS — Z923 Personal history of irradiation: Secondary | ICD-10-CM | POA: Insufficient documentation

## 2020-05-10 DIAGNOSIS — C439 Malignant melanoma of skin, unspecified: Secondary | ICD-10-CM

## 2020-05-10 DIAGNOSIS — M069 Rheumatoid arthritis, unspecified: Secondary | ICD-10-CM | POA: Insufficient documentation

## 2020-05-10 DIAGNOSIS — Z9011 Acquired absence of right breast and nipple: Secondary | ICD-10-CM | POA: Diagnosis not present

## 2020-05-10 LAB — CBC WITH DIFFERENTIAL/PLATELET
Abs Immature Granulocytes: 0.14 10*3/uL — ABNORMAL HIGH (ref 0.00–0.07)
Basophils Absolute: 0 10*3/uL (ref 0.0–0.1)
Basophils Relative: 0 %
Eosinophils Absolute: 0 10*3/uL (ref 0.0–0.5)
Eosinophils Relative: 0 %
HCT: 36.3 % (ref 36.0–46.0)
Hemoglobin: 12 g/dL (ref 12.0–15.0)
Immature Granulocytes: 1 %
Lymphocytes Relative: 8 %
Lymphs Abs: 1 10*3/uL (ref 0.7–4.0)
MCH: 25.8 pg — ABNORMAL LOW (ref 26.0–34.0)
MCHC: 33.1 g/dL (ref 30.0–36.0)
MCV: 77.9 fL — ABNORMAL LOW (ref 80.0–100.0)
Monocytes Absolute: 0.6 10*3/uL (ref 0.1–1.0)
Monocytes Relative: 5 %
Neutro Abs: 10.7 10*3/uL — ABNORMAL HIGH (ref 1.7–7.7)
Neutrophils Relative %: 86 %
Platelets: 239 10*3/uL (ref 150–400)
RBC: 4.66 MIL/uL (ref 3.87–5.11)
RDW: 16.9 % — ABNORMAL HIGH (ref 11.5–15.5)
WBC: 12.5 10*3/uL — ABNORMAL HIGH (ref 4.0–10.5)
nRBC: 0 % (ref 0.0–0.2)

## 2020-05-10 LAB — IRON AND TIBC
Iron: 19 ug/dL — ABNORMAL LOW (ref 28–170)
Saturation Ratios: 7 % — ABNORMAL LOW (ref 10.4–31.8)
TIBC: 272 ug/dL (ref 250–450)
UIBC: 253 ug/dL

## 2020-05-10 LAB — COMPREHENSIVE METABOLIC PANEL
ALT: 12 U/L (ref 0–44)
AST: 11 U/L — ABNORMAL LOW (ref 15–41)
Albumin: 4.1 g/dL (ref 3.5–5.0)
Alkaline Phosphatase: 54 U/L (ref 38–126)
Anion gap: 11 (ref 5–15)
BUN: 13 mg/dL (ref 8–23)
CO2: 24 mmol/L (ref 22–32)
Calcium: 9.1 mg/dL (ref 8.9–10.3)
Chloride: 97 mmol/L — ABNORMAL LOW (ref 98–111)
Creatinine, Ser: 0.87 mg/dL (ref 0.44–1.00)
GFR calc Af Amer: >60 mL/min (ref >60)
GFR calc non Af Amer: >60 mL/min (ref >60)
Glucose, Bld: 142 mg/dL — ABNORMAL HIGH (ref 70–99)
Potassium: 3.9 mmol/L (ref 3.5–5.1)
Sodium: 132 mmol/L — ABNORMAL LOW (ref 135–145)
Total Bilirubin: 0.7 mg/dL (ref 0.3–1.2)
Total Protein: 8.1 g/dL (ref 6.5–8.1)

## 2020-05-10 LAB — FERRITIN: Ferritin: 1136 ng/mL — ABNORMAL HIGH (ref 11–307)

## 2020-05-10 LAB — LACTATE DEHYDROGENASE: LDH: 101 U/L (ref 98–192)

## 2020-05-10 MED ORDER — IOHEXOL 300 MG/ML  SOLN
100.0000 mL | Freq: Once | INTRAMUSCULAR | Status: AC | PRN
  Administered 2020-05-10: 100 mL via INTRAVENOUS

## 2020-05-10 NOTE — Telephone Encounter (Signed)
I called the patient. She just wanted to confirm her appt.

## 2020-05-10 NOTE — Telephone Encounter (Signed)
Pt left a VM for someone to call her about her appts. Could you please contact her. Thank you

## 2020-05-11 ENCOUNTER — Other Ambulatory Visit: Payer: Self-pay

## 2020-05-11 NOTE — Progress Notes (Signed)
[name]  [address], [address] [place], [place] [address] Phone: [phone]  Fax: [phone]   Clinic Day:  05/12/2020  Referring physician: Kirk Ruths, MD  Chief Complaint: Kristina Pittman is a 74 y.o. female with a history of stage IIC melanoma s/p resection and adjuvant therapy who is seen for 12 month assessment.  HPI:  The patient was last seen in the oncology clinic on 05/18/2019. At that time, she was doing well. Exam revealed no adenopathy or hepatosplenomegaly. Hematocrit was 38.4, hemoglobin 12.4, platelets 232,000, WBC 5,900. Sodium was 133. LDH was 116. There was no evidence of recurrent disease.  Screening mammogram on 12/13/2019 revealed no evidence of malignancy.   Chest abdomen and pelvis CT on 05/10/2020 revealed no evidence of metastatic disease to the chest, abdomen or pelvis. There was cylindrical bronchiectasis and scarring in the RIGHT middle and bilateral lower lobes as before. Along the LEFT flank, an ovoid partially calcified fatty density area measured 5.1 x 3.9 cm and was unchanged since the PET exam from 2018 perhaps even slightly smaller as it measured approximately 5.8 x 4.7 cm on the prior exam. This may represent an area of fat necrosis and was likely smaller than in 2018. There was aortic atherosclerosis.  Labs on 05/10/2020 revealed a hematocrit 36.3, hemoglobin 12.0, MCV 77.9, platelets 239,000, WBC 12,500 (ANC 10,700).  Sodium 132.  AST 11. Ferritin 1,136 with an iron saturation 7% and TIBC 272. LDH was 101 normal).  During the interim, she has been doing well. She is currently staying at home most of the time because she is not ready to go out in a crowd. She has some moles on her right side and has not seen a dermatologist in a long time. The patient has rheumatoid arthritis. She stopped taking Enbrel in 2015 but is still on methotrexate.  The patient eats mostly chicken and fish and tries to stay away from fast  food. She denies any ice pica.   The patient received the Moderna COVID-19 vaccine on 12/27/2019 and 01/25/2020.  She had her last colonoscopy in 2015 and is due for one this year.   Past Medical History:  Diagnosis Date  . Arthritis   . Breast cancer ([place])    right   . Collagen vascular disease (HCC)    RA  . Depression   . Hypercholesterolemia   . Hyperlipidemia   . Hypertension   . Iron deficiency anemia   . Iron deficiency anemia   . Neurofibromatosis ([place])   . Osteoporosis   . Personal history of chemotherapy   . Personal history of radiation therapy   . Skin cancer    melanoma    Past Surgical History:  Procedure Laterality Date  . ABDOMINAL HYSTERECTOMY    . Excision  right breast mass    . MASTECTOMY Right 2015    Family History  Problem Relation Age of Onset  . Breast cancer Neg Hx     Social History:  reports that she has never smoked. She has never used smokeless tobacco. She reports that she does not drink alcohol. No history on file for drug use. She is retired from the [employer] after 27.5 years. She has no children. She denies any exposure to any toxins and radiation. The patient lives in [place].  The patient is alone today.  Allergies:  Allergies  Allergen Reactions  . Rosuvastatin Nausea And Vomiting and Other (See Comments)  . Sulfa Antibiotics  Other reaction(s): Unknown   Current Medications: Current Outpatient Medications  Medication Sig Dispense Refill  . alendronate (FOSAMAX) 70 MG tablet TAKE 1 TABLET BY MOUTH EVERY 7 DAYS. TAKE WITH A FULL GLASS OF WATER AND DO NOT LIE DOWN FOR THE NEXT 30 MINUTES.    Marland Kitchen amLODipine (NORVASC) 2.5 MG tablet TAKE ONE (1) TABLET BY MOUTH ONCE DAILY    . aspirin EC 81 MG tablet Take 81 mg by mouth daily.     . Calcium-Vitamin D 600-200 MG-UNIT per tablet Take 1 tablet by mouth 2 (two) times daily.     Marland Kitchen ezetimibe (ZETIA) 10 MG tablet Take 10 mg by mouth daily.     . ferrous sulfate 325 (65 FE) MG EC tablet  Take 325 mg by mouth daily with breakfast.     . folic acid (FOLVITE) 1 MG tablet Take 1 mg by mouth daily.     Marland Kitchen losartan (COZAAR) 100 MG tablet Take 100 mg by mouth daily.    . methotrexate (RHEUMATREX) 2.5 MG tablet Take 12.5 mg by mouth once a week.     . Multiple Vitamin (MULTI-VITAMINS) TABS Take by mouth.    . simvastatin (ZOCOR) 20 MG tablet Take 20 mg by mouth daily.     Marland Kitchen spironolactone (ALDACTONE) 50 MG tablet Take by mouth.    . tizanidine (ZANAFLEX) 2 MG capsule Take 2 mg by mouth as needed for muscle spasms.     No current facility-administered medications for this visit.   Facility-Administered Medications Ordered in Other Visits  Medication Dose Route Frequency Provider Last Rate Last Admin  . peginterferon alfa-2b (SYLATRON) injection 198 mcg  3 mcg/kg Subcutaneous Once Choksi, Janak, MD      . peginterferon alfa-2b (SYLATRON) injection 198 mcg  3 mcg/kg Subcutaneous Once Choksi, Delorise Shiner, MD        Review of Systems  Constitutional: Positive for weight loss (8 lbs). Negative for chills, fever and malaise/fatigue.       Doing well.  HENT: Negative.  Negative for congestion, ear discharge, ear pain, hearing loss, nosebleeds, sinus pain, sore throat and tinnitus.   Eyes: Negative.  Negative for blurred vision.  Respiratory: Negative.  Negative for cough, hemoptysis and shortness of breath.   Cardiovascular: Negative.  Negative for chest pain, palpitations and leg swelling.  Gastrointestinal: Negative.  Negative for abdominal pain, constipation, diarrhea, heartburn, nausea and vomiting.       Denies ice pica.  Genitourinary: Negative.  Negative for dysuria, frequency, hematuria and urgency.  Musculoskeletal: Negative for back pain, joint pain (arthiritis), myalgias and neck pain.  Skin: Negative for itching and rash.       Moles.  Neurological: Negative.  Negative for dizziness, tingling, sensory change, weakness and headaches.  Endo/Heme/Allergies: Negative.  Does not  bruise/bleed easily.  Psychiatric/Behavioral: Negative.  Negative for depression and memory loss. The patient is not nervous/anxious and does not have insomnia.   All other systems reviewed and are negative.  Performance status (ECOG): 0  Vital Signs Blood pressure 115/69, pulse 92, temperature 98 F (36.7 C), temperature source Tympanic, weight 161 lb 0.7 oz (73.1 kg), SpO2 100 %.  Physical Exam Vitals and nursing note reviewed.  Constitutional:      General: She is not in acute distress.    Appearance: She is well-developed. She is not diaphoretic.  HENT:     Head: Normocephalic and atraumatic.     Mouth/Throat:     Pharynx: No oropharyngeal exudate.  Comments: Geographic tongue. Eyes:     General: No scleral icterus.    Conjunctiva/sclera: Conjunctivae normal.     Pupils: Pupils are equal, round, and reactive to light.     Comments: Brown eyes. Mask.  Cardiovascular:     Rate and Rhythm: Normal rate and regular rhythm.     Heart sounds: Normal heart sounds. No murmur heard.   Pulmonary:     Effort: No respiratory distress.     Breath sounds: Normal breath sounds.  Abdominal:     General: Bowel sounds are normal.     Palpations: Abdomen is soft.     Tenderness: There is no abdominal tenderness.  Musculoskeletal:        General: No tenderness. Normal range of motion.     Cervical back: Normal range of motion and neck supple.     Comments: Ulnar deviation. 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  Lymphadenopathy:     Head:     Right side of head: No preauricular, posterior auricular or occipital adenopathy.     Left side of head: No preauricular, posterior auricular or occipital adenopathy.     Cervical: No cervical adenopathy.     Upper Body:     Right upper body: No supraclavicular or axillary adenopathy.     Left upper body: No supraclavicular or axillary adenopathy.     Lower Body: No right inguinal adenopathy. No left inguinal adenopathy.  Skin:    General: Skin is  warm and dry.  Neurological:     Mental Status: She is alert and oriented to person, place, and time.     Deep Tendon Reflexes: Reflexes are normal and symmetric.  Psychiatric:        Behavior: Behavior normal.        Thought Content: Thought content normal.        Judgment: Judgment normal.    Orders Only on 05/10/2020  Component Date Value Ref Range Status  . Iron 05/10/2020 19* 28 - 170 ug/dL Final  . TIBC 05/10/2020 272  250 - 450 ug/dL Final  . Saturation Ratios 05/10/2020 7* 10.4 - 31.8 % Final  . UIBC 05/10/2020 253  ug/dL Final   Performed at [facility], [address], [place], [place] [address]  . Ferritin 05/10/2020 1,136* 11 - 307 ng/mL Final   Performed at [facility], [address] [place], [place] [address]  Appointment on 05/10/2020  Component Date Value Ref Range Status  . LDH 05/10/2020 101  98 - 192 U/L Final   Performed at [facility], [address], [place], [place] [address]  . Sodium 05/10/2020 132* 135 - 145 mmol/L Final  . Potassium 05/10/2020 3.9  3.5 - 5.1 mmol/L Final  . Chloride 05/10/2020 97* 98 - 111 mmol/L Final  . CO2 05/10/2020 24  22 - 32 mmol/L Final  . Glucose, Bld 05/10/2020 142* 70 - 99 mg/dL Final   Glucose reference range applies only to samples taken after fasting for at least 8 hours.  . BUN 05/10/2020 13  8 - 23 mg/dL Final  . Creatinine, Ser 05/10/2020 0.87  0.44 - 1.00 mg/dL Final  . Calcium 05/10/2020 9.1  8.9 - 10.3 mg/dL Final  . Total Protein 05/10/2020 8.1  6.5 - 8.1 g/dL Final  . Albumin 05/10/2020 4.1  3.5 - 5.0 g/dL Final  . AST 05/10/2020 11* 15 - 41 U/L Final  . ALT 05/10/2020 12  0 - 44 U/L Final  . Alkaline  Phosphatase 05/10/2020 54  38 - 126 U/L Final  . Total Bilirubin 05/10/2020 0.7  0.3 - 1.2 mg/dL Final  . GFR calc non Af Amer 05/10/2020 >60  >60 mL/min Final  . GFR calc Af Amer 05/10/2020 >60  >60 mL/min Final  . Anion gap 05/10/2020 11  5 - 15 Final   Performed at [facility], [address] [place], [place] [address]  . WBC 05/10/2020 12.5* 4.0 - 10.5 K/uL Final  . RBC 05/10/2020 4.66  3.87 - 5.11 MIL/uL Final  . Hemoglobin 05/10/2020 12.0  12.0 - 15.0 g/dL Final  . HCT 05/10/2020 36.3  36 - 46 % Final  . MCV 05/10/2020 77.9* 80.0 - 100.0 fL Final  . MCH 05/10/2020 25.8* 26.0 - 34.0 pg Final  . MCHC 05/10/2020 33.1  30.0 - 36.0 g/dL Final  . RDW 05/10/2020 16.9* 11.5 - 15.5 % Final  . Platelets 05/10/2020 239  150 - 400 K/uL Final  . nRBC 05/10/2020 0.0  0.0 - 0.2 % Final  . Neutrophils Relative % 05/10/2020 86  % Final  . Neutro Abs 05/10/2020 10.7* 1.7 - 7.7 K/uL Final  . Lymphocytes Relative 05/10/2020 8  % Final  . Lymphs Abs 05/10/2020 1.0  0.7 - 4.0 K/uL Final  . Monocytes Relative 05/10/2020 5  % Final  . Monocytes Absolute 05/10/2020 0.6  0 - 1 K/uL Final  . Eosinophils Relative 05/10/2020 0  % Final  . Eosinophils Absolute 05/10/2020 0.0  0 - 0 K/uL Final  . Basophils Relative 05/10/2020 0  % Final  . Basophils Absolute 05/10/2020 0.0  0 - 0 K/uL Final  . Immature Granulocytes 05/10/2020 1  % Final  . Abs Immature Granulocytes 05/10/2020 0.14* 0.00 - 0.07 K/uL Final   Performed at [facility], [address], [place], [place] [address]    Assessment:  MIALANI REICKS is a 74 y.o. 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  There was ulceration present.  Invasive melanoma involved the peripheral and dep margins.  Clark level was V.  There was 12 mitosis per mm2.  There were tumor infiltrating lymphocytes.  There was no lymphvascular or perineural invasion.  Pathologic stage was pT4bNxMx.  PET scan on 03/17/2014 revealed no residual malignancy or metastatic disease.  Head MRI on 03/22/2014 revealed no acute intracranial findings.     She underwent modified right radical mastectomy and lymph node dissection  on 03/24/2014.  Pathology revealed no residual melanoma.  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  There were 17 lymph nodes negative for malignancy.  BRAF V600E was negative.  Final pathologic stage was pT4bN0.  She received right chest wall and peripheral lymphatic radiation.  She received peg-interferon alfa-2b (completed 06/2015).  She has undergone yearly PET scans (11/09/2014, 04/23/2016, 05/15/2017) and CT scans (05/14/2018, 05/10/2019) with no evidence of recurrent disease.  Chest, abdomen, and pelvis CT on 05/10/2019 revealed no findings to suggest metastatic disease.  Chest abdomen and pelvis CT on 05/10/2020 revealed no evidence of metastatic disease to the chest, abdomen or pelvis. There was cylindrical bronchiectasis and scarring in the RIGHT middle and bilateral lower lobes as before. Along the LEFT flank, an ovoid partially calcified fatty density area measured 5.1 x 3.9 cm and was unchanged since the PET exam from 2018 perhaps even slightly smaller as it measured approximately 5.8 x 4.7 cm on the  prior exam. This may represent an area of fat necrosis and was likely smaller than in 2018.  Left mammogram on 12/13/2019 revealed no evidence of malignancy.   She has rheumatoid arthritis and is on methotrexate.  Enbrel was discontinued in 2015.  She is followed by Dr Jefm Bryant.  The patient received the Moderna COVID-19 vaccine on 12/27/2019 and 01/25/2020.  Symptomatically, she is doing well.  She denies any bleeding.  Hemoglobin is 12.0.  Plan: 1.   Review labs from 05/10/2020. 2.   Stage II melanoma of the right breast  She has undergone excision, lymph node dissection, radiation, and interferon.  Chest, abdomen, and pelvic CT on 05/10/2020 was personally reviewed.  Agree with radiology interpretation.   No evidence of metastatic disease.   Additional findings reviewed.  Continue annual dermatology exam. 3.   Microcytic RBC indices  Hematocrit 36.3.  Hemoglobin 12.0.  MCV 77.9.   Ferritin 1136  with an iron saturation of 7% and a TIBC of 272.  Prior MCV normal with low iron saturation c/w iron deficiency.  Suspect elevated ferritin secondary to an acute phase reactant (elevated sed rate/CRP).   Discuss follow-up with Dr Jefm Bryant (note sent).  Patient denies any bleeding.  Patient notes due for a colonoscopy this year.   Discuss follow-up with GI (note to Stephens November, NP) 4.   Referral to dermatology. 5.   RTC in 2 months for MD assessment and labs (CBC with diff, retic, ferritin, iron studies, sed rate, CRP).  I discussed the assessment and treatment plan with the patient.  The patient was provided an opportunity to ask questions and all were answered.  The patient agreed with the plan and demonstrated an understanding of the instructions.  The patient was advised to call back if the symptoms worsen or if the condition fails to improve as anticipated.  I provided 22 minutes of face-to-face time during this this encounter and > 50% was spent counseling as documented under my assessment and plan.   Melissa C. Mike Gip, MD, PhD    05/12/2020, 3:54 PM  I, De Burrs, am acting as scribe for [employer]. Mike Gip, MD, PhD.  I, Melissa C. Mike Gip, MD, have reviewed the above documentation for accuracy and completeness, and I agree with the above.

## 2020-05-12 ENCOUNTER — Encounter: Payer: Self-pay | Admitting: Hematology and Oncology

## 2020-05-12 ENCOUNTER — Telehealth: Payer: Self-pay

## 2020-05-12 ENCOUNTER — Inpatient Hospital Stay (HOSPITAL_BASED_OUTPATIENT_CLINIC_OR_DEPARTMENT_OTHER): Payer: Medicare Other | Admitting: Hematology and Oncology

## 2020-05-12 VITALS — BP 115/69 | HR 92 | Temp 98.0°F | Wt 161.0 lb

## 2020-05-12 DIAGNOSIS — D509 Iron deficiency anemia, unspecified: Secondary | ICD-10-CM

## 2020-05-12 DIAGNOSIS — C439 Malignant melanoma of skin, unspecified: Secondary | ICD-10-CM | POA: Diagnosis not present

## 2020-05-12 DIAGNOSIS — R7989 Other specified abnormal findings of blood chemistry: Secondary | ICD-10-CM | POA: Insufficient documentation

## 2020-05-12 DIAGNOSIS — Z85820 Personal history of malignant melanoma of skin: Secondary | ICD-10-CM | POA: Diagnosis not present

## 2020-05-12 NOTE — Progress Notes (Signed)
No new changes noted today 

## 2020-05-12 NOTE — Telephone Encounter (Signed)
Faxed new patient information to Dr Phillip Heal office. fax conformation confirmed.

## 2020-05-15 ENCOUNTER — Ambulatory Visit: Payer: Medicare Other | Admitting: Nurse Practitioner

## 2020-07-04 ENCOUNTER — Other Ambulatory Visit: Payer: Self-pay

## 2020-07-04 DIAGNOSIS — R7989 Other specified abnormal findings of blood chemistry: Secondary | ICD-10-CM

## 2020-07-04 DIAGNOSIS — D0352 Melanoma in situ of breast (skin) (soft tissue): Secondary | ICD-10-CM

## 2020-07-04 DIAGNOSIS — C439 Malignant melanoma of skin, unspecified: Secondary | ICD-10-CM

## 2020-07-04 DIAGNOSIS — D509 Iron deficiency anemia, unspecified: Secondary | ICD-10-CM

## 2020-07-05 NOTE — Progress Notes (Signed)
[facility]  [address], [address] [place], [place] [address] Phone: [phone]  Fax: [phone]   Clinic Day: 07/06/2020   Referring physician: Kirk Ruths, MD  Chief Complaint: Kristina Pittman is a 74 y.o. 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  HPI:  The patient was last seen in the oncology clinic on 05/12/2020. At that time, she was doing well.  She denied any bleeding. Hematocrit was 36.3, hemoglobin 12.0, platelets 239,000, WBC 12,500 (ANC 10,700). Ferritin was 1,136 with an iron saturation of 7% and a TIBC of 272. Sodium was 132.  LDH was 101. We discussed dermatology referral and GI follow up for colonoscopy. Elevated ferritin was suspected to be secondary to an acute phase reactant.  The patient followed up with Dr. Jefm Bryant on 05/29/2020 for her rheumatoid arthritis. She had no major flares. She remained on methotrexate. She has a follow up in 6 months.  During the interim, she has been "good." She states that her weight has been going up and down because she has been eating less. She still has a good appetite. She denies ice pica, restless legs, vaginal bleeding, blood in the stools, hematuria, nausea, vomiting, and diarrhea.  The patient states that she saw a dermatologist recently. They looked at the mole on her left chest and were not concerned.  She has not heard anything about a GI appointment.    Past Medical History:  Diagnosis Date  . Arthritis   . Breast cancer ([place])    right   . Collagen vascular disease ([facility])    RA  . Depression   . Hypercholesterolemia   . Hyperlipidemia   . Hypertension   . Iron deficiency anemia   . Iron deficiency anemia   . Neurofibromatosis ([place])   . Osteoporosis   . Personal history of chemotherapy   . Personal history of radiation therapy   . Skin cancer    melanoma    Past Surgical History:  Procedure Laterality  Date  . ABDOMINAL HYSTERECTOMY    . Excision  right breast mass    . MASTECTOMY Right 2015    Family History  Problem Relation Age of Onset  . Breast cancer Neg Hx     Social History:  reports that she has never smoked. She has never used smokeless tobacco. She reports that she does not drink alcohol. No history on file for drug use. She is retired from the [employer] after 27.5 years. She has no children. She denies any exposure to any toxins and radiation. The patient lives in [place].  The patient is alone today.  Allergies:  Allergies  Allergen Reactions  . Rosuvastatin Nausea And Vomiting and Other (See Comments)  . Sulfa Antibiotics     Other reaction(s): Unknown   Current Medications: Current Outpatient Medications  Medication Sig Dispense Refill  . alendronate (FOSAMAX) 70 MG tablet TAKE 1 TABLET BY MOUTH EVERY 7 DAYS. TAKE WITH A FULL GLASS OF WATER AND DO NOT LIE DOWN FOR THE NEXT 30 MINUTES.    Marland Kitchen amLODipine (NORVASC) 2.5 MG tablet TAKE ONE (1) TABLET BY MOUTH ONCE DAILY    . aspirin EC 81 MG tablet Take 81 mg by mouth daily.     . Calcium-Vitamin D 600-200 MG-UNIT per tablet Take 1 tablet by mouth 2 (two) times daily.     Marland Kitchen ezetimibe (ZETIA) 10 MG tablet Take 10 mg by  mouth daily.     . ferrous sulfate 325 (65 FE) MG EC tablet Take 325 mg by mouth daily with breakfast.     . folic acid (FOLVITE) 1 MG tablet Take 1 mg by mouth daily.     Marland Kitchen losartan (COZAAR) 100 MG tablet Take 100 mg by mouth daily.    . methotrexate (RHEUMATREX) 2.5 MG tablet Take 12.5 mg by mouth once a week.     . Multiple Vitamin (MULTI-VITAMINS) TABS Take by mouth.    . simvastatin (ZOCOR) 20 MG tablet Take 20 mg by mouth daily.     Marland Kitchen spironolactone (ALDACTONE) 50 MG tablet Take by mouth.    . tizanidine (ZANAFLEX) 2 MG capsule Take 2 mg by mouth as needed for muscle spasms.     No current facility-administered medications for this visit.   Facility-Administered Medications Ordered in Other Visits   Medication Dose Route Frequency Provider Last Rate Last Admin  . peginterferon alfa-2b (SYLATRON) injection 198 mcg  3 mcg/kg Subcutaneous Once Choksi, Janak, MD      . peginterferon alfa-2b (SYLATRON) injection 198 mcg  3 mcg/kg Subcutaneous Once Choksi, Delorise Shiner, MD        Review of Systems  Constitutional: Positive for weight loss (2 lbs). Negative for chills, diaphoresis, fever and malaise/fatigue.       Doing well.  HENT: Negative.  Negative for congestion, ear discharge, ear pain, hearing loss, nosebleeds, sinus pain, sore throat and tinnitus.   Eyes: Negative.  Negative for blurred vision.  Respiratory: Negative.  Negative for cough, hemoptysis and shortness of breath.   Cardiovascular: Negative.  Negative for chest pain, palpitations and leg swelling.  Gastrointestinal: Negative.  Negative for abdominal pain, constipation, diarrhea, heartburn, nausea and vomiting.       Denies ice pica.  Genitourinary: Negative.  Negative for dysuria, frequency, hematuria and urgency.  Musculoskeletal: Negative for back pain, joint pain (arthiritis), myalgias and neck pain.  Skin: Negative for itching and rash.  Neurological: Negative.  Negative for dizziness, tingling, sensory change, weakness and headaches.       No restless legs.  Endo/Heme/Allergies: Negative.  Does not bruise/bleed easily.  Psychiatric/Behavioral: Negative.  Negative for depression and memory loss. The patient is not nervous/anxious and does not have insomnia.   All other systems reviewed and are negative.  Performance status (ECOG): 0  Vital Signs Blood pressure 120/71, pulse 96, temperature 98.8 F (37.1 C), temperature source Tympanic, weight 159 lb 11.6 oz (72.4 kg), SpO2 100 %.  Physical Exam Vitals and nursing note reviewed.  Constitutional:      General: She is not in acute distress.    Appearance: She is well-developed. She is not diaphoretic.  HENT:     Head: Normocephalic and atraumatic.     Mouth/Throat:      Mouth: Mucous membranes are moist.     Pharynx: Oropharynx is clear. No oropharyngeal exudate.  Eyes:     General: No scleral icterus.    Conjunctiva/sclera: Conjunctivae normal.     Pupils: Pupils are equal, round, and reactive to light.     Comments: Brown eyes. Mask.  Cardiovascular:     Rate and Rhythm: Normal rate and regular rhythm.     Heart sounds: Normal heart sounds. No murmur heard.   Pulmonary:     Effort: No respiratory distress.     Breath sounds: Normal breath sounds.  Abdominal:     General: Bowel sounds are normal.     Palpations: Abdomen is soft.  There is no hepatomegaly or splenomegaly.     Tenderness: There is no abdominal tenderness.  Musculoskeletal:        General: No tenderness. Normal range of motion.     Cervical back: Normal range of motion and neck supple.  Lymphadenopathy:     Head:     Right side of head: No preauricular, posterior auricular or occipital adenopathy.     Left side of head: No preauricular, posterior auricular or occipital adenopathy.     Cervical: No cervical adenopathy.     Upper Body:     Right upper body: No supraclavicular or axillary adenopathy.     Left upper body: No supraclavicular or axillary adenopathy.     Lower Body: No right inguinal adenopathy. No left inguinal adenopathy.  Skin:    General: Skin is warm and dry.  Neurological:     Mental Status: She is alert and oriented to person, place, and time.     Deep Tendon Reflexes: Reflexes are normal and symmetric.  Psychiatric:        Behavior: Behavior normal.        Thought Content: Thought content normal.        Judgment: Judgment normal.    Appointment on 07/06/2020  Component Date Value Ref Range Status  . WBC 07/06/2020 7.3  4.0 - 10.5 K/uL Final  . RBC 07/06/2020 4.53  3.87 - 5.11 MIL/uL Final  . Hemoglobin 07/06/2020 11.6* 12.0 - 15.0 g/dL Final  . HCT 07/06/2020 36.4  36 - 46 % Final  . MCV 07/06/2020 80.4  80.0 - 100.0 fL Final  . MCH 07/06/2020 25.6*  26.0 - 34.0 pg Final  . MCHC 07/06/2020 31.9  30.0 - 36.0 g/dL Final  . RDW 07/06/2020 17.4* 11.5 - 15.5 % Final  . Platelets 07/06/2020 299  150 - 400 K/uL Final  . nRBC 07/06/2020 0.0  0.0 - 0.2 % Final  . Neutrophils Relative % 07/06/2020 79  % Final  . Neutro Abs 07/06/2020 5.7  1.7 - 7.7 K/uL Final  . Lymphocytes Relative 07/06/2020 13  % Final  . Lymphs Abs 07/06/2020 1.0  0.7 - 4.0 K/uL Final  . Monocytes Relative 07/06/2020 6  % Final  . Monocytes Absolute 07/06/2020 0.5  0 - 1 K/uL Final  . Eosinophils Relative 07/06/2020 1  % Final  . Eosinophils Absolute 07/06/2020 0.1  0 - 0 K/uL Final  . Basophils Relative 07/06/2020 0  % Final  . Basophils Absolute 07/06/2020 0.0  0 - 0 K/uL Final  . Immature Granulocytes 07/06/2020 1  % Final  . Abs Immature Granulocytes 07/06/2020 0.07  0.00 - 0.07 K/uL Final   Performed at [facility], [address] [place], [place] [address]    Assessment:  Kristina Pittman is a 74 y.o. 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  There was ulceration present.  Invasive melanoma involved the peripheral and dep margins.  Clark level was V.  There was 12 mitosis per mm2.  There were tumor infiltrating lymphocytes.  There was no lymphvascular or perineural invasion.  Pathologic stage was [id number].  PET scan on 03/17/2014 revealed no residual malignancy or metastatic disease.  Head MRI on 03/22/2014 revealed no acute intracranial findings.     She underwent modified right radical mastectomy and lymph node dissection on 03/24/2014.  Pathology revealed no residual melanoma.  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  There were 17 lymph nodes negative for malignancy.  BRAF V600E was negative.  Final pathologic stage was pT4bN0.  She received right chest wall and peripheral lymphatic radiation.  She received peg-interferon alfa-2b (completed  06/2015).  She has undergone yearly PET scans (11/09/2014, 04/23/2016, 05/15/2017) and CT scans (05/14/2018, 05/10/2019) with no evidence of recurrent disease.  Chest, abdomen, and pelvis CT on 05/10/2019 revealed no findings to suggest metastatic disease.  Chest abdomen and pelvis CT on 05/10/2020 revealed no evidence of metastatic disease to the chest, abdomen or pelvis. There was cylindrical bronchiectasis and scarring in the RIGHT middle and bilateral lower lobes as before. Along the LEFT flank, an ovoid partially calcified fatty density area measured 5.1 x 3.9 cm and was unchanged since the PET exam from 2018 perhaps even slightly smaller as it measured approximately 5.8 x 4.7 cm on the prior exam. This may represent an area of fat necrosis and was likely smaller than in 2018.  Left mammogram on 12/13/2019 revealed no evidence of malignancy.   She has rheumatoid arthritis and is on methotrexate.  Enbrel was discontinued in 2015.  She is followed by Dr Jefm Bryant.  The patient received the Moderna COVID-19 vaccine on 12/27/2019 and 01/25/2020.  Symptomatically, she feels good. Weight fluctuates.  Exam is stable.  Plan: 1.   Labs today: CBC with diff, retic, ferritin, iron studies, sed rate, CRP. 2.   Stage II melanoma of the right breast  She has undergone excision, lymph node dissection, radiation, and interferon.  Chest, abdomen, and pelvic CT on 05/10/2020 revealed no evidence of metastatic disease.  Review plan for annual dermatology exam. 3.   Microcytic RBC indices  Hematocrit 36.3.  Hemoglobin 12.0.  MCV 77.9 on 05/10/2020.   Ferritin 1136 with an iron saturation of 7% and a TIBC of 272.  Hematocrit 36.4.  Hemoglobin 11.6.  MCV 80.4 on 07/06/2020.   Ferritin 962 with an iron saturation of 16% and a TIBC of 290.  Prior MCV normal with low iron saturation c/w iron deficiency.  Suspect elevated ferritin secondary to an acute phase reactant (elevated sed rate/CRP).   Contact Dr.  Jefm Bryant to discuss.  Patient denies any bleeding.  Encouraged follow-up at the [place] clinic. 4.   Elevated ferritin  Etiology unknown.  Suspect acute phase reactant.  Iron saturation low and thus not consistent with hemochromatosis. 5.   MD to contact Dr Jefm Bryant. 6.   RN to call patient with iron studies. 7.   Please schedule f/u appt with [facility] or [place])- colonscopy due. 8.   RTC in 6 months for MD assessment and labs (CBC with diff, CMP, ferritin, iron studies).  I discussed the assessment and treatment plan with the patient.  The patient was provided an opportunity to ask questions and all were answered.  The patient agreed with the plan and demonstrated an understanding of the instructions.  The patient was advised to call back if the symptoms worsen or if the condition fails to improve as anticipated.  I provided 15 minutes of face-to-face time during this this encounter and > 50% was spent counseling as documented under my assessment and plan.  An additional 5+minutes were spent reviewing his chart (Epic and Care Everywhere) including notes, labs, and imaging studies.    [place]hower C. Mike Gip, MD, PhD    07/06/2020, 10:21 AM   I, De Burrs, am acting as scribe for [employer]. Mike Gip, MD, PhD.  I, Porcia Morganti C.  Mike Gip, MD, have reviewed the above documentation for accuracy and completeness, and I agree with the above.

## 2020-07-06 ENCOUNTER — Inpatient Hospital Stay: Payer: Medicare Other

## 2020-07-06 ENCOUNTER — Encounter: Payer: Self-pay | Admitting: Hematology and Oncology

## 2020-07-06 ENCOUNTER — Other Ambulatory Visit: Payer: Self-pay

## 2020-07-06 ENCOUNTER — Inpatient Hospital Stay: Payer: Medicare Other | Attending: Hematology and Oncology | Admitting: Hematology and Oncology

## 2020-07-06 VITALS — BP 120/71 | HR 96 | Temp 98.8°F | Wt 159.7 lb

## 2020-07-06 DIAGNOSIS — D509 Iron deficiency anemia, unspecified: Secondary | ICD-10-CM

## 2020-07-06 DIAGNOSIS — C439 Malignant melanoma of skin, unspecified: Secondary | ICD-10-CM

## 2020-07-06 DIAGNOSIS — Z9011 Acquired absence of right breast and nipple: Secondary | ICD-10-CM | POA: Diagnosis not present

## 2020-07-06 DIAGNOSIS — R7989 Other specified abnormal findings of blood chemistry: Secondary | ICD-10-CM

## 2020-07-06 DIAGNOSIS — Z85820 Personal history of malignant melanoma of skin: Secondary | ICD-10-CM | POA: Diagnosis not present

## 2020-07-06 DIAGNOSIS — D0352 Melanoma in situ of breast (skin) (soft tissue): Secondary | ICD-10-CM

## 2020-07-06 DIAGNOSIS — M069 Rheumatoid arthritis, unspecified: Secondary | ICD-10-CM | POA: Insufficient documentation

## 2020-07-06 DIAGNOSIS — Z923 Personal history of irradiation: Secondary | ICD-10-CM | POA: Insufficient documentation

## 2020-07-06 LAB — CBC WITH DIFFERENTIAL/PLATELET
Abs Immature Granulocytes: 0.07 10*3/uL (ref 0.00–0.07)
Basophils Absolute: 0 10*3/uL (ref 0.0–0.1)
Basophils Relative: 0 %
Eosinophils Absolute: 0.1 10*3/uL (ref 0.0–0.5)
Eosinophils Relative: 1 %
HCT: 36.4 % (ref 36.0–46.0)
Hemoglobin: 11.6 g/dL — ABNORMAL LOW (ref 12.0–15.0)
Immature Granulocytes: 1 %
Lymphocytes Relative: 13 %
Lymphs Abs: 1 10*3/uL (ref 0.7–4.0)
MCH: 25.6 pg — ABNORMAL LOW (ref 26.0–34.0)
MCHC: 31.9 g/dL (ref 30.0–36.0)
MCV: 80.4 fL (ref 80.0–100.0)
Monocytes Absolute: 0.5 10*3/uL (ref 0.1–1.0)
Monocytes Relative: 6 %
Neutro Abs: 5.7 10*3/uL (ref 1.7–7.7)
Neutrophils Relative %: 79 %
Platelets: 299 10*3/uL (ref 150–400)
RBC: 4.53 MIL/uL (ref 3.87–5.11)
RDW: 17.4 % — ABNORMAL HIGH (ref 11.5–15.5)
WBC: 7.3 10*3/uL (ref 4.0–10.5)
nRBC: 0 % (ref 0.0–0.2)

## 2020-07-06 LAB — IRON AND TIBC
Iron: 45 ug/dL (ref 28–170)
Saturation Ratios: 16 % (ref 10.4–31.8)
TIBC: 290 ug/dL (ref 250–450)
UIBC: 245 ug/dL

## 2020-07-06 LAB — RETICULOCYTES
Immature Retic Fract: 26.5 % — ABNORMAL HIGH (ref 2.3–15.9)
RBC.: 4.51 MIL/uL (ref 3.87–5.11)
Retic Count, Absolute: 75.8 10*3/uL (ref 19.0–186.0)
Retic Ct Pct: 1.7 % (ref 0.4–3.1)

## 2020-07-06 LAB — FERRITIN: Ferritin: 962 ng/mL — ABNORMAL HIGH (ref 11–307)

## 2020-07-06 LAB — SEDIMENTATION RATE: Sed Rate: 59 mm/hr — ABNORMAL HIGH (ref 0–30)

## 2020-07-06 LAB — C-REACTIVE PROTEIN: CRP: 3.1 mg/dL — ABNORMAL HIGH (ref <1.0)

## 2020-07-06 NOTE — Progress Notes (Signed)
No new changes noted today 

## 2020-07-07 ENCOUNTER — Telehealth: Payer: Self-pay

## 2020-07-07 NOTE — Telephone Encounter (Signed)
New patient referral has been faxed to Dr Madolyn Frieze office for a visit ( Colonscopy) which is past due. Fax conformation received.

## 2020-08-01 IMAGING — CT CT ABD-PELV W/ CM
1 of 3 series · 11 of 32 positions shown, 17 images · IV contrast (omnipaque)
Comparison: May 10, 2019

CLINICAL DATA: Surveillance for melanoma.

EXAM:
CT CHEST, ABDOMEN, AND PELVIS WITH CONTRAST
TECHNIQUE: Multidetector CT imaging of the chest, abdomen and pelvis was
performed following the standard protocol during bolus
administration of intravenous contrast.
CONTRAST:  100mL OMNIPAQUE IOHEXOL 300 MG/ML  SOLN

[Series 2: cap with · axial · 0.79mm/px · z∈[-877,-352]mm · 11 of 127 slices shown, 17 images]
[im 11/127  soft-tissue]
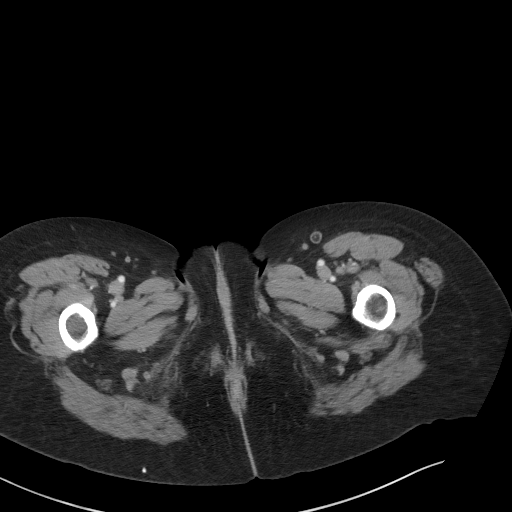
[im 11/127  bone]
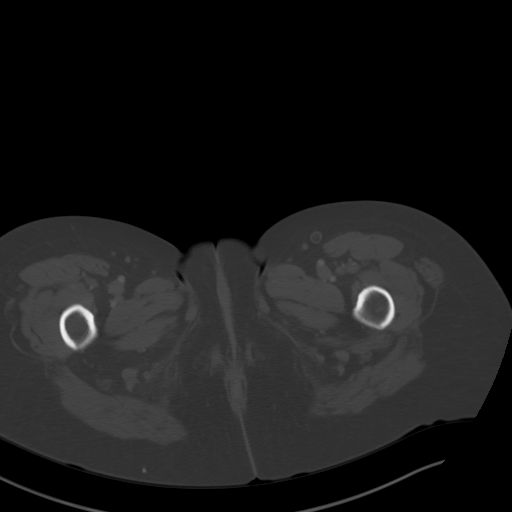
[im 22/127  soft-tissue]
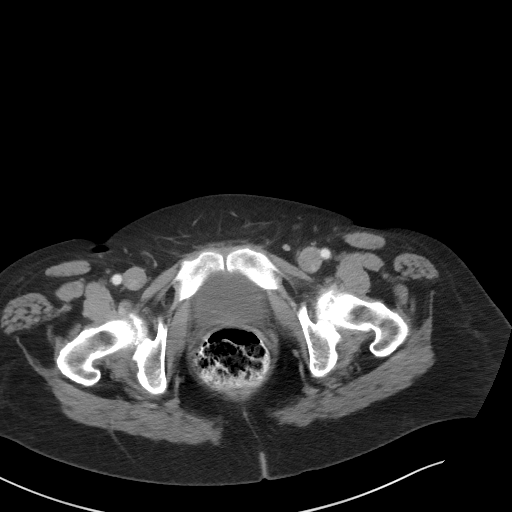
[im 32/127  soft-tissue]
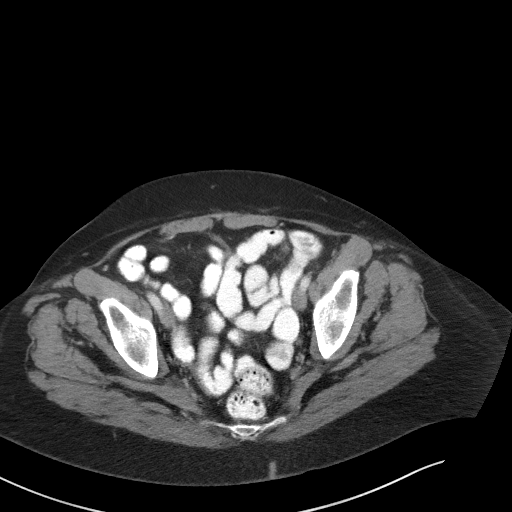
[im 43/127  soft-tissue]
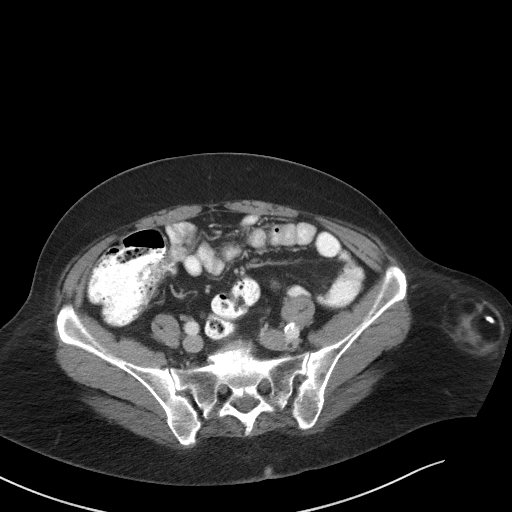
[im 53/127  soft-tissue]
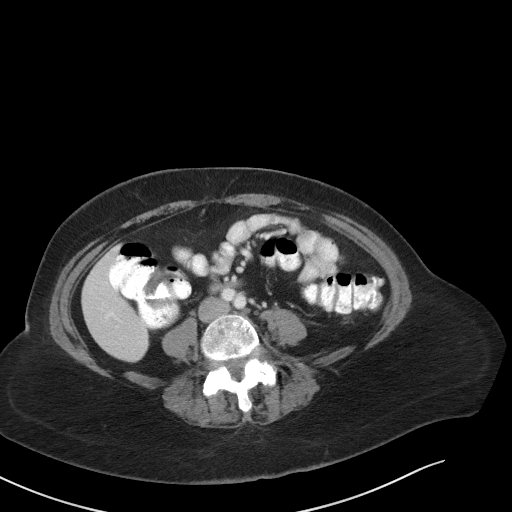
[im 64/127  soft-tissue]
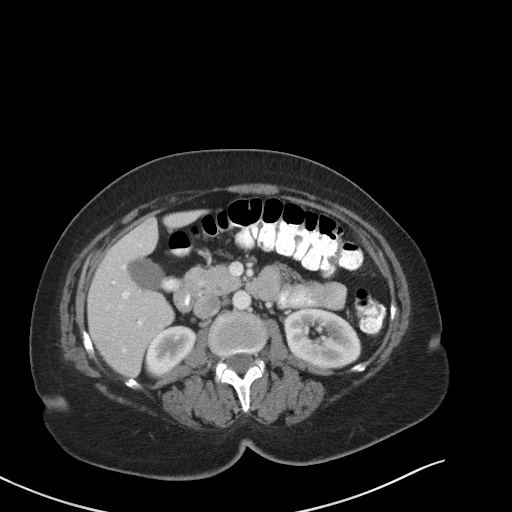
[im 74/127  soft-tissue]
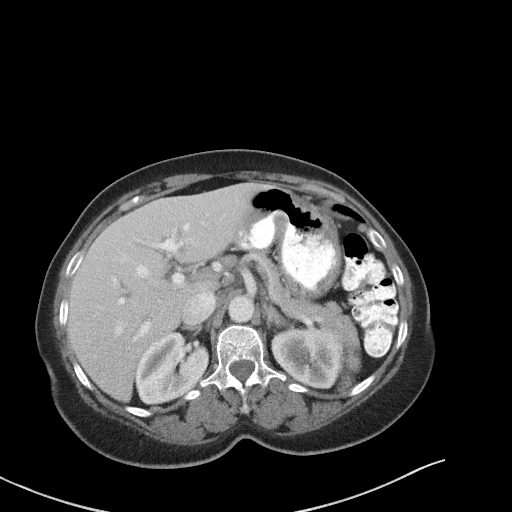
[im 85/127  soft-tissue]
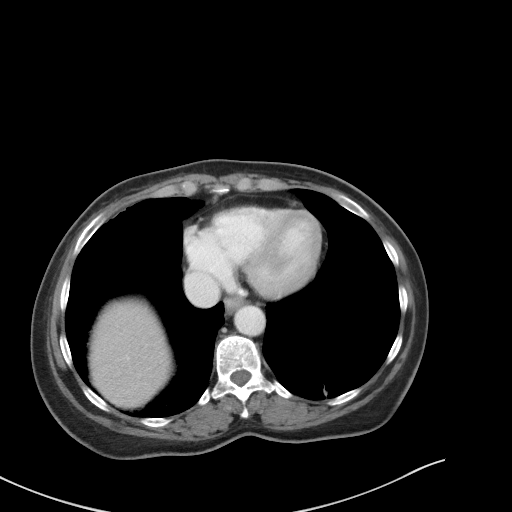
[im 85/127  lung]
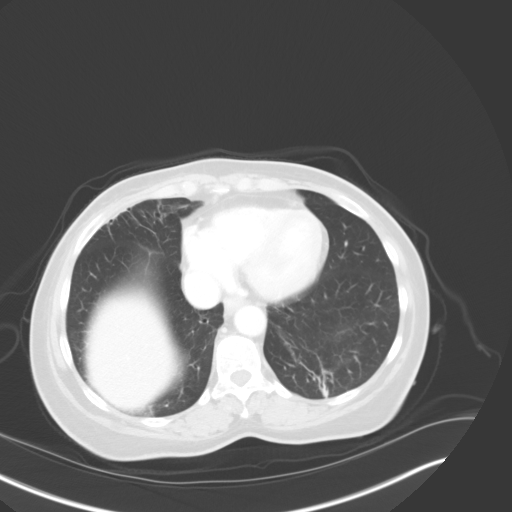
[im 95/127  soft-tissue]
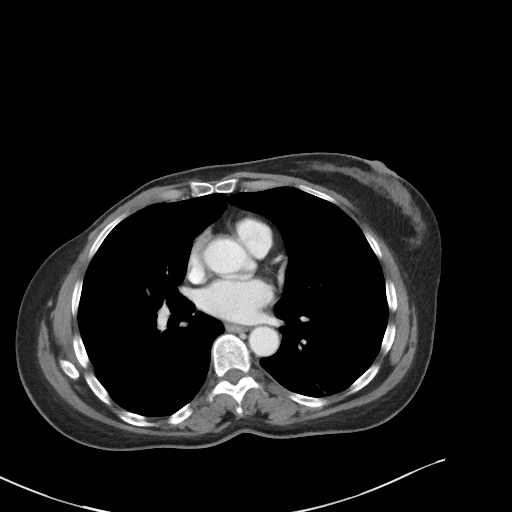
[im 95/127  lung]
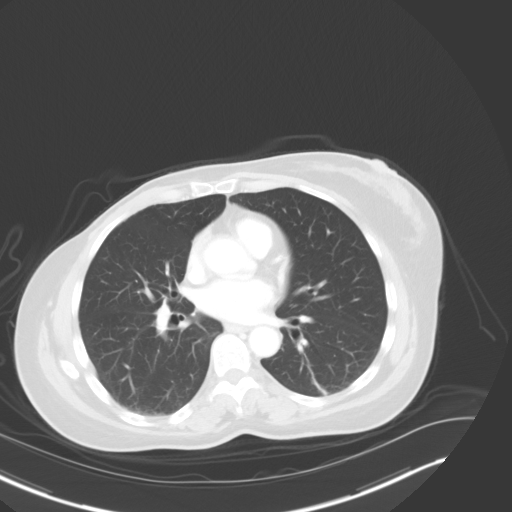
[im 95/127  bone]
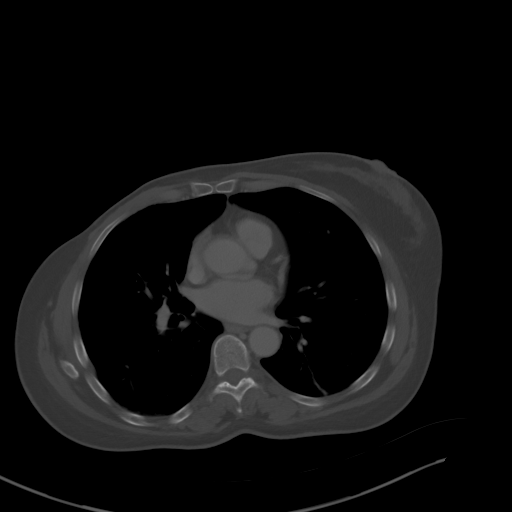
[im 106/127  soft-tissue]
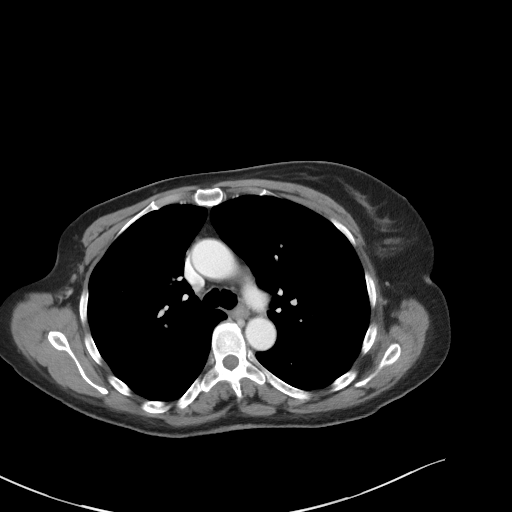
[im 106/127  lung]
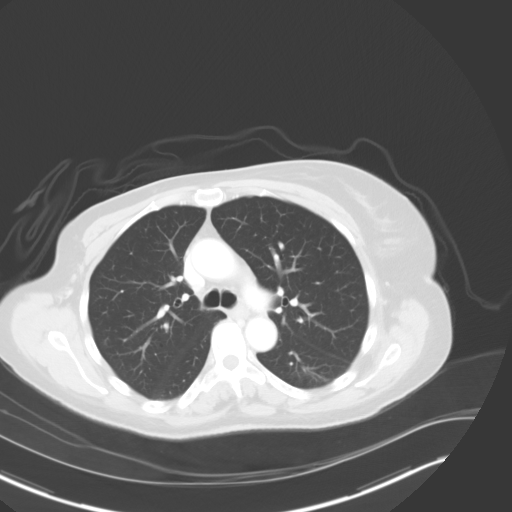
[im 116/127  soft-tissue]
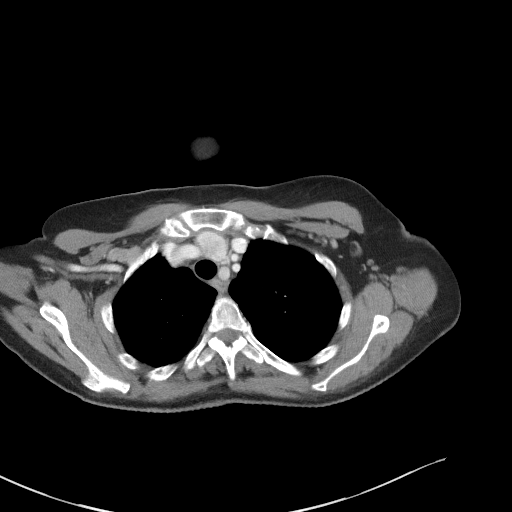
[im 116/127  lung]
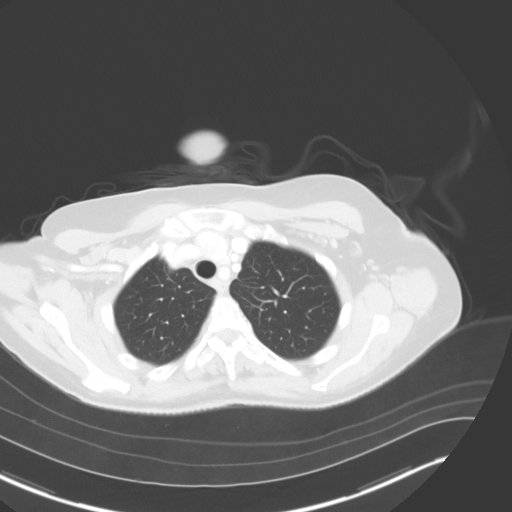

[11 of 32 positions shown; findings below may reference images not displayed]

FINDINGS: CT CHEST FINDINGS

Cardiovascular: Heart size is normal. Aortic caliber is normal.
Scattered atheromatous plaque in the thoracic aorta with similar
appearance to prior study. Venous assessment of central pulmonary
vasculature is unremarkable.

Mediastinum/Nodes: Post RIGHT mastectomy. No axillary
lymphadenopathy. No internal mammary adenopathy. No thoracic inlet
adenopathy. No mediastinal or hilar lymphadenopathy.

Lungs/Pleura: Similar appearance of parenchymal scarring and some
areas of bronchiectasis the LEFT lung base in an the RIGHT middle
lobe and to a lesser extent RIGHT lower lobe. No effusion. No
consolidation. Airways are patent.

Musculoskeletal: RIGHT mastectomy as before.

CT ABDOMEN PELVIS FINDINGS

Hepatobiliary: Liver normal in size and contour. No focal,
suspicious hepatic lesion. No biliary duct dilation. No
pericholecystic stranding.

Pancreas: Pancreas is normal without focal lesion or ductal
dilation.

Spleen: Spleen normal in size and contour.  No focal lesion.

Adrenals/Urinary Tract: Adrenal glands are normal.

Smooth renal contours. No hydronephrosis. Small low-density lesion
in the RIGHT kidney posteriorly likely small cyst unchanged. Urinary
bladder under distended limiting assessment.

Stomach/Bowel: No acute gastrointestinal process. Appendix is
normal.

Vascular/Lymphatic: No aneurysmal dilation of the abdominal aorta.
Scattered lymph nodes in the upper abdomen. None with pathologic
enlargement and none with change since the prior exam. Largest is a
celiac lymph node measuring approximately 9 mm. No pelvic
lymphadenopathy.

Reproductive: Post hysterectomy.

Other: Along the LEFT flank in ovoid partially calcified fatty
density area measures 5.1 x 3.9 cm and is unchanged since the PET
exam from 2472 perhaps even slightly smaller as it measured
approximately 5.8 x 4.7 cm on the prior exam. No abdominal wall
hernia. No ascites.

Musculoskeletal: No acute musculoskeletal process. No destructive
bone lesion. Spinal degenerative changes.
IMPRESSION: 1. No evidence of metastatic disease to the chest, abdomen or
pelvis.
2. Cylindrical bronchiectasis and scarring in the RIGHT middle and
bilateral lower lobes as before.
3. Along the LEFT flank in ovoid partially calcified fatty density
area measures 5.1 x 3.9 cm and is unchanged since the PET exam from
2472 perhaps even slightly smaller as it measured approximately
x 4.7 cm on the prior exam. This may represent an area of fat
necrosis and is likely smaller than in 2472.
4. Aortic atherosclerosis.

Aortic Atherosclerosis ([id number]).

## 2020-11-14 ENCOUNTER — Other Ambulatory Visit: Payer: Self-pay | Admitting: Internal Medicine

## 2020-11-14 DIAGNOSIS — Z1231 Encounter for screening mammogram for malignant neoplasm of breast: Secondary | ICD-10-CM

## 2020-12-13 ENCOUNTER — Other Ambulatory Visit: Payer: Self-pay

## 2020-12-13 ENCOUNTER — Ambulatory Visit
Admission: RE | Admit: 2020-12-13 | Discharge: 2020-12-13 | Disposition: A | Discharge status: Home / Self Care | Payer: Medicare Other | Source: Ambulatory Visit | Attending: Internal Medicine | Admitting: Internal Medicine

## 2020-12-13 DIAGNOSIS — Z1231 Encounter for screening mammogram for malignant neoplasm of breast: Secondary | ICD-10-CM | POA: Insufficient documentation

## 2020-12-28 ENCOUNTER — Other Ambulatory Visit: Payer: Self-pay

## 2021-01-03 NOTE — Progress Notes (Signed)
**Note Kristina-Identified via Obfuscation** [facility]  [address], [address] [place], [place] [address] Phone: [phone]  Fax: [phone]   Clinic Day: 01/04/2021   Referring physician: Kirk Ruths, MD  Chief Complaint: Kristina Pittman is a 75 y.o. 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  HPI:  The patient was last seen in the oncology clinic on 07/06/2020. At that time, she felt good. Weight fluctuated. Exam was stable. Hematocrit was 36.4, hemoglobin 11.6, platelets 299,000, WBC 7,300. Ferritin was 962 with an iron saturation of 16% and a TIBC of 290. Reticulocyte count was 1.7%. CRP was 3.1. Sed rate was 59.  Bone density at [facility] on 08/28/2020 revealed osteoporosis with a T score of -3.0 at the lumbar spine.  Left screening mammogram on 12/15/2020 revealed no evidence of malignancy.  The patient saw Dr. Jefm Bryant in the rheumatology clinic. Rheumatoid arthritis was felt stable and mainly fixed. She continued methotrexate with labs every 3 months. Follow-up is scheduled in 6 months.    During the interim, she has been "real good." Her diet is very good. She drinks Ensure or Boost everyday. She has stable moles across her chest and has been evaluated by Dr. Phillip Heal once. She denies lumps, bumps, and bleeding of any kind.  The patient takes calcium. She performs monthly breast self exams.   Past Medical History:  Diagnosis Date  . Arthritis   . Breast cancer ([place])    right   . Collagen vascular disease (HCC)    RA  . Depression   . Hypercholesterolemia   . Hyperlipidemia   . Hypertension   . Iron deficiency anemia   . Iron deficiency anemia   . Neurofibromatosis ([place])   . Osteoporosis   . Personal history of chemotherapy   . Personal history of radiation therapy   . Skin cancer    melanoma    Past Surgical History:  Procedure Laterality Date  . ABDOMINAL HYSTERECTOMY    . Excision  right breast mass     . MASTECTOMY Right 2015    Family History  Problem Relation Age of Onset  . Breast cancer Neg Hx     Social History:  reports that she has never smoked. She has never used smokeless tobacco. She reports that she does not drink alcohol. No history on file for drug use. She is retired from the [employer] after 27.5 years. She has no children. She denies any exposure to any toxins and radiation. The patient lives in [place].  The patient is alone today.  Allergies:  Allergies  Allergen Reactions  . Rosuvastatin Nausea And Vomiting and Other (See Comments)  . Sulfa Antibiotics     Other reaction(s): Unknown   Current Medications: Current Outpatient Medications  Medication Sig Dispense Refill  . amLODipine (NORVASC) 2.5 MG tablet TAKE ONE (1) TABLET BY MOUTH ONCE DAILY    . aspirin EC 81 MG tablet Take 81 mg by mouth daily.     . Calcium-Vitamin D 600-200 MG-UNIT per tablet Take 1 tablet by mouth 2 (two) times daily.     Marland Kitchen ezetimibe (ZETIA) 10 MG tablet Take 10 mg by mouth daily.     . ferrous sulfate 325 (65 FE) MG EC tablet Take 325 mg by mouth daily with breakfast.     . folic acid (FOLVITE) 1 MG tablet Take 1 mg by mouth daily.     Marland Kitchen losartan (COZAAR) 100 MG  tablet Take 100 mg by mouth daily.    . methotrexate (RHEUMATREX) 2.5 MG tablet Take 12.5 mg by mouth once a week.     . Multiple Vitamin (MULTI-VITAMINS) TABS Take by mouth.    . simvastatin (ZOCOR) 20 MG tablet Take 20 mg by mouth daily.     Marland Kitchen spironolactone (ALDACTONE) 50 MG tablet Take 50 mg by mouth daily.    . tizanidine (ZANAFLEX) 2 MG capsule Take 2 mg by mouth as needed for muscle spasms. (Patient not taking: Reported on 01/04/2021)     No current facility-administered medications for this visit.   Facility-Administered Medications Ordered in Other Visits  Medication Dose Route Frequency Provider Last Rate Last Admin  . peginterferon alfa-2b (SYLATRON) injection 198 mcg  3 mcg/kg Subcutaneous Once Choksi, Delorise Shiner, MD      .  peginterferon alfa-2b (SYLATRON) injection 198 mcg  3 mcg/kg Subcutaneous Once Forest Gleason, MD        Review of Systems  Constitutional: Negative for chills, diaphoresis, fever, malaise/fatigue and weight loss (up 11 lbs).       Feels "real good."  HENT: Negative.  Negative for congestion, ear discharge, ear pain, hearing loss, nosebleeds, sinus pain, sore throat and tinnitus.   Eyes: Negative.  Negative for blurred vision.  Respiratory: Negative.  Negative for cough, hemoptysis and shortness of breath.   Cardiovascular: Negative.  Negative for chest pain, palpitations and leg swelling.  Gastrointestinal: Negative.  Negative for abdominal pain, blood in stool, constipation, diarrhea, heartburn, melena, nausea and vomiting.       Diet is very good. Drinks Boost/Ensure daily.  Genitourinary: Negative.  Negative for dysuria, frequency, hematuria and urgency.  Musculoskeletal: Negative for back pain, joint pain (arthritis), myalgias and neck pain.  Skin: Negative for itching and rash.  Neurological: Negative.  Negative for dizziness, tingling, sensory change, weakness and headaches.  Endo/Heme/Allergies: Negative.  Does not bruise/bleed easily.  Psychiatric/Behavioral: Negative.  Negative for depression and memory loss. The patient is not nervous/anxious and does not have insomnia.   All other systems reviewed and are negative.  Performance status (ECOG): 0  Vital Signs Blood pressure 122/68, pulse 96, temperature (!) 97 F (36.1 C), temperature source Tympanic, weight 170 lb 6.7 oz (77.3 kg), SpO2 100 %.  Physical Exam Vitals and nursing note reviewed.  Constitutional:      General: She is not in acute distress.    Appearance: She is well-developed. She is not diaphoretic.  HENT:     Head: Normocephalic and atraumatic.     Mouth/Throat:     Mouth: Mucous membranes are moist.     Pharynx: Oropharynx is clear. No oropharyngeal exudate.  Eyes:     General: No scleral icterus.     Conjunctiva/sclera: Conjunctivae normal.     Pupils: Pupils are equal, round, and reactive to light.     Comments: Brown eyes. Mask.  Cardiovascular:     Rate and Rhythm: Normal rate and regular rhythm.     Heart sounds: Normal heart sounds. No murmur heard.   Pulmonary:     Effort: No respiratory distress.     Breath sounds: Normal breath sounds.  Chest:  Breasts:     Right: No swelling, bleeding, mass, skin change, tenderness, axillary adenopathy or supraclavicular adenopathy.     Left: Skin change (fibrocystic changes at 5 o clock and 2 o clock) present. No swelling, bleeding, mass, tenderness, axillary adenopathy or supraclavicular adenopathy.    Abdominal:     General: Bowel  sounds are normal.     Palpations: Abdomen is soft. There is no hepatomegaly or splenomegaly.     Tenderness: There is no abdominal tenderness.  Musculoskeletal:        General: No tenderness. Normal range of motion.     Cervical back: Normal range of motion and neck supple.  Lymphadenopathy:     Head:     Right side of head: No preauricular, posterior auricular or occipital adenopathy.     Left side of head: No preauricular, posterior auricular or occipital adenopathy.     Cervical: No cervical adenopathy.     Upper Body:     Right upper body: No supraclavicular or axillary adenopathy.     Left upper body: No supraclavicular or axillary adenopathy.     Lower Body: No right inguinal adenopathy. No left inguinal adenopathy.  Skin:    General: Skin is warm and dry.  Neurological:     Mental Status: She is alert and oriented to person, place, and time.     Deep Tendon Reflexes: Reflexes are normal and symmetric.  Psychiatric:        Behavior: Behavior normal.        Thought Content: Thought content normal.        Judgment: Judgment normal.    Appointment on 01/04/2021  Component Date Value Ref Range Status  . Sodium 01/04/2021 134* 135 - 145 mmol/L Final  . Potassium 01/04/2021 3.9  3.5 - 5.1  mmol/L Final  . Chloride 01/04/2021 99  98 - 111 mmol/L Final  . CO2 01/04/2021 24  22 - 32 mmol/L Final  . Glucose, Bld 01/04/2021 172* 70 - 99 mg/dL Final   Glucose reference range applies only to samples taken after fasting for at least 8 hours.  . BUN 01/04/2021 15  8 - 23 mg/dL Final  . Creatinine, Ser 01/04/2021 0.74  0.44 - 1.00 mg/dL Final  . Calcium 01/04/2021 9.3  8.9 - 10.3 mg/dL Final  . Total Protein 01/04/2021 7.4  6.5 - 8.1 g/dL Final  . Albumin 01/04/2021 4.0  3.5 - 5.0 g/dL Final  . AST 01/04/2021 15  15 - 41 U/L Final  . ALT 01/04/2021 17  0 - 44 U/L Final  . Alkaline Phosphatase 01/04/2021 51  38 - 126 U/L Final  . Total Bilirubin 01/04/2021 0.5  0.3 - 1.2 mg/dL Final  . GFR, Estimated 01/04/2021 >60  >60 mL/min Final   Comment: (NOTE) Calculated using the CKD-EPI Creatinine Equation (2021)   . Anion gap 01/04/2021 11  5 - 15 Final   Performed at [facility], [address], [place], [place] [address]  . WBC 01/04/2021 6.9  4.0 - 10.5 K/uL Final  . RBC 01/04/2021 4.44  3.87 - 5.11 MIL/uL Final  . Hemoglobin 01/04/2021 11.5* 12.0 - 15.0 g/dL Final  . HCT 01/04/2021 35.7* 36.0 - 46.0 % Final  . MCV 01/04/2021 80.4  80.0 - 100.0 fL Final  . MCH 01/04/2021 25.9* 26.0 - 34.0 pg Final  . MCHC 01/04/2021 32.2  30.0 - 36.0 g/dL Final  . RDW 01/04/2021 16.8* 11.5 - 15.5 % Final  . Platelets 01/04/2021 245  150 - 400 K/uL Final  . nRBC 01/04/2021 0.0  0.0 - 0.2 % Final  . Neutrophils Relative % 01/04/2021 82  % Final  . Neutro Abs 01/04/2021 5.7  1.7 - 7.7 K/uL Final  . Lymphocytes Relative 01/04/2021 13  % Final  . Lymphs Abs 01/04/2021 0.9  0.7 - 4.0 K/uL Final  . Monocytes Relative 01/04/2021 4  % Final  . Monocytes Absolute 01/04/2021 0.3  0.1 - 1.0 K/uL Final  . Eosinophils Relative 01/04/2021 0  % Final  . Eosinophils Absolute 01/04/2021 0.0  0.0 - 0.5 K/uL Final  . Basophils Relative 01/04/2021 0  % Final  . Basophils Absolute 01/04/2021 0.0  0.0 -  0.1 K/uL Final  . Immature Granulocytes 01/04/2021 1  % Final  . Abs Immature Granulocytes 01/04/2021 0.08* 0.00 - 0.07 K/uL Final   Performed at [facility], [address] [place], [place] [address]    Assessment:  TANAJA GANGER is a 75 y.o. 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  There was ulceration present.  Invasive melanoma involved the peripheral and dep margins.  Clark level was V.  There was 12 mitosis per mm2.  There were tumor infiltrating lymphocytes.  There was no lymphvascular or perineural invasion.  Pathologic stage was pT4bNxMx.  PET scan on 03/17/2014 revealed no residual malignancy or metastatic disease.  Head MRI on 03/22/2014 revealed no acute intracranial findings.     She underwent modified right radical mastectomy and lymph node dissection on 03/24/2014.  Pathology revealed no residual melanoma.  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  There were 17 lymph nodes negative for malignancy.  BRAF V600E was negative.  Final pathologic stage was pT4bN0.  She received right chest wall and peripheral lymphatic radiation.  She received peg-interferon alfa-2b (completed 06/2015).  She has undergone yearly PET scans (11/09/2014, 04/23/2016, 05/15/2017) and CT scans (05/14/2018, 05/10/2019) with no evidence of recurrent disease.  Chest, abdomen, and pelvis CT on 05/10/2019 revealed no findings to suggest metastatic disease.  Chest abdomen and pelvis CT on 05/10/2020 revealed no evidence of metastatic disease to the chest, abdomen or pelvis. There was cylindrical bronchiectasis and scarring in the RIGHT middle and bilateral lower lobes as before. Along the LEFT flank, an ovoid partially calcified fatty density area measured 5.1 x 3.9 cm and was unchanged since the PET exam from 2018 perhaps even slightly smaller as it measured approximately 5.8 x 4.7 cm on the prior exam.  This may represent an area of fat necrosis and was likely smaller than in 2018.  Left screening mammogram on 12/15/2020 revealed no evidence of malignancy.  She has rheumatoid arthritis and is on methotrexate.  Enbrel was discontinued in 2015.  She is followed by Dr Jefm Bryant.  The patient received the Moderna COVID-19 vaccine on 12/27/2019 and 01/25/2020.  Symptomatically, she feels "real good." She denies any concerns.  Exam is unremarkable.  Plan: 1.   Labs today: CBC with diff, CMP, ferritin, iron studies. 2.   Stage II melanoma of the right breast  She has undergone excision, lymph node dissection, radiation, and interferon.  Chest, abdomen, and pelvic CT on 05/10/2020 revealed no evidence of metastatic disease.  Exam reveals no evidence of recurrent disease.  Continue annual dermatology examination. 3.   Microcytic RBC indices, resolved  Hematocrit 36.3.  Hemoglobin 12.0.  MCV 77.9 on 05/10/2020.   Ferritin 1136 with an iron saturation of 7% and a TIBC of 272.  Hematocrit 35.7.  Hemoglobin 10.5.  MCV 80.4 on 01/04/2021.   Ferritin 700 with an iron saturation of 12% and a TIBC of 278.  Prior MCV normal with low iron saturation c/w iron deficiency.  Suspect elevated ferritin secondary to an  acute phase reactant (elevated sed rate/CRP).  Patient denies any bleeding.  Encouraged follow-up at the [place] clinic. 4.   Elevated ferritin  Etiology unknown.  Suspect acute phase reactant.  Ferritin is declining without intervention   Iron saturation is low and thus cannot consistent with hemochromatosis. 5.   Dermatology referral. 6.   Mammogram on 12/17/2021. 7.   RTC in 1 year for MD assessment, labs (CBC with diff, CMP, ferritin, iron studies), and review of interval mammogram.  I discussed the assessment and treatment plan with the patient.  The patient was provided an opportunity to ask questions and all were answered.  The patient agreed with the plan and demonstrated an  understanding of the instructions.  The patient was advised to call back if the symptoms worsen or if the condition fails to improve as anticipated.   Kristina Mercer C. Kristina Gip, MD, Kristina Pittman    01/04/2021, 11:36 AM   I, Kristina Pittman, am acting as scribe for [employer]. Kristina Gip, MD, Kristina Pittman.  I, Kristina Meuth C. Kristina Gip, MD, have reviewed the above documentation for accuracy and completeness, and I agree with the above.

## 2021-01-04 ENCOUNTER — Other Ambulatory Visit: Payer: Self-pay

## 2021-01-04 ENCOUNTER — Inpatient Hospital Stay: Payer: Medicare Other

## 2021-01-04 ENCOUNTER — Inpatient Hospital Stay: Payer: Medicare Other | Attending: Hematology and Oncology | Admitting: Hematology and Oncology

## 2021-01-04 ENCOUNTER — Encounter: Payer: Self-pay | Admitting: Hematology and Oncology

## 2021-01-04 ENCOUNTER — Other Ambulatory Visit: Payer: Self-pay | Admitting: Hematology and Oncology

## 2021-01-04 VITALS — BP 122/68 | HR 96 | Temp 97.0°F | Wt 170.4 lb

## 2021-01-04 DIAGNOSIS — D225 Melanocytic nevi of trunk: Secondary | ICD-10-CM | POA: Insufficient documentation

## 2021-01-04 DIAGNOSIS — R7989 Other specified abnormal findings of blood chemistry: Secondary | ICD-10-CM | POA: Diagnosis not present

## 2021-01-04 DIAGNOSIS — Z90710 Acquired absence of both cervix and uterus: Secondary | ICD-10-CM | POA: Insufficient documentation

## 2021-01-04 DIAGNOSIS — D509 Iron deficiency anemia, unspecified: Secondary | ICD-10-CM

## 2021-01-04 DIAGNOSIS — C439 Malignant melanoma of skin, unspecified: Secondary | ICD-10-CM

## 2021-01-04 DIAGNOSIS — M810 Age-related osteoporosis without current pathological fracture: Secondary | ICD-10-CM | POA: Diagnosis not present

## 2021-01-04 DIAGNOSIS — Z0000 Encounter for general adult medical examination without abnormal findings: Secondary | ICD-10-CM

## 2021-01-04 DIAGNOSIS — Z79899 Other long term (current) drug therapy: Secondary | ICD-10-CM | POA: Insufficient documentation

## 2021-01-04 DIAGNOSIS — Z9221 Personal history of antineoplastic chemotherapy: Secondary | ICD-10-CM | POA: Insufficient documentation

## 2021-01-04 DIAGNOSIS — Z9011 Acquired absence of right breast and nipple: Secondary | ICD-10-CM | POA: Insufficient documentation

## 2021-01-04 DIAGNOSIS — M069 Rheumatoid arthritis, unspecified: Secondary | ICD-10-CM | POA: Diagnosis not present

## 2021-01-04 DIAGNOSIS — Z85820 Personal history of malignant melanoma of skin: Secondary | ICD-10-CM | POA: Diagnosis not present

## 2021-01-04 DIAGNOSIS — Z923 Personal history of irradiation: Secondary | ICD-10-CM | POA: Insufficient documentation

## 2021-01-04 LAB — CBC WITH DIFFERENTIAL/PLATELET
Abs Immature Granulocytes: 0.08 10*3/uL — ABNORMAL HIGH (ref 0.00–0.07)
Basophils Absolute: 0 10*3/uL (ref 0.0–0.1)
Basophils Relative: 0 %
Eosinophils Absolute: 0 10*3/uL (ref 0.0–0.5)
Eosinophils Relative: 0 %
HCT: 35.7 % — ABNORMAL LOW (ref 36.0–46.0)
Hemoglobin: 11.5 g/dL — ABNORMAL LOW (ref 12.0–15.0)
Immature Granulocytes: 1 %
Lymphocytes Relative: 13 %
Lymphs Abs: 0.9 10*3/uL (ref 0.7–4.0)
MCH: 25.9 pg — ABNORMAL LOW (ref 26.0–34.0)
MCHC: 32.2 g/dL (ref 30.0–36.0)
MCV: 80.4 fL (ref 80.0–100.0)
Monocytes Absolute: 0.3 10*3/uL (ref 0.1–1.0)
Monocytes Relative: 4 %
Neutro Abs: 5.7 10*3/uL (ref 1.7–7.7)
Neutrophils Relative %: 82 %
Platelets: 245 10*3/uL (ref 150–400)
RBC: 4.44 MIL/uL (ref 3.87–5.11)
RDW: 16.8 % — ABNORMAL HIGH (ref 11.5–15.5)
WBC: 6.9 10*3/uL (ref 4.0–10.5)
nRBC: 0 % (ref 0.0–0.2)

## 2021-01-04 LAB — C-REACTIVE PROTEIN: CRP: 1.8 mg/dL — ABNORMAL HIGH (ref <1.0)

## 2021-01-04 LAB — COMPREHENSIVE METABOLIC PANEL
ALT: 17 U/L (ref 0–44)
AST: 15 U/L (ref 15–41)
Albumin: 4 g/dL (ref 3.5–5.0)
Alkaline Phosphatase: 51 U/L (ref 38–126)
Anion gap: 11 (ref 5–15)
BUN: 15 mg/dL (ref 8–23)
CO2: 24 mmol/L (ref 22–32)
Calcium: 9.3 mg/dL (ref 8.9–10.3)
Chloride: 99 mmol/L (ref 98–111)
Creatinine, Ser: 0.74 mg/dL (ref 0.44–1.00)
GFR, Estimated: >60 mL/min (ref >60)
Glucose, Bld: 172 mg/dL — ABNORMAL HIGH (ref 70–99)
Potassium: 3.9 mmol/L (ref 3.5–5.1)
Sodium: 134 mmol/L — ABNORMAL LOW (ref 135–145)
Total Bilirubin: 0.5 mg/dL (ref 0.3–1.2)
Total Protein: 7.4 g/dL (ref 6.5–8.1)

## 2021-01-04 LAB — IRON AND TIBC
Iron: 36 ug/dL (ref 28–170)
Saturation Ratios: 12 % (ref 10.4–31.8)
TIBC: 314 ug/dL (ref 250–450)
UIBC: 278 ug/dL

## 2021-01-04 LAB — FERRITIN: Ferritin: 700 ng/mL — ABNORMAL HIGH (ref 11–307)

## 2021-01-04 LAB — SEDIMENTATION RATE: Sed Rate: 43 mm/hr — ABNORMAL HIGH (ref 0–30)

## 2021-02-04 DIAGNOSIS — Z0000 Encounter for general adult medical examination without abnormal findings: Secondary | ICD-10-CM | POA: Insufficient documentation

## 2021-04-19 ENCOUNTER — Encounter: Payer: Self-pay | Admitting: *Deleted

## 2021-04-20 ENCOUNTER — Encounter
Admission: RE | Disposition: A | Discharge status: Home / Self Care | Payer: Self-pay | Source: Home / Self Care | Attending: Gastroenterology

## 2021-04-20 ENCOUNTER — Ambulatory Visit: Payer: Medicare Other | Admitting: Certified Registered"

## 2021-04-20 ENCOUNTER — Ambulatory Visit: Admit: 2021-04-20 | Payer: Medicare Other

## 2021-04-20 ENCOUNTER — Ambulatory Visit
Admission: RE | Admit: 2021-04-20 | Discharge: 2021-04-20 | Disposition: A | Discharge status: Home / Self Care | Payer: Medicare Other | Attending: Gastroenterology | Admitting: Gastroenterology

## 2021-04-20 DIAGNOSIS — K5730 Diverticulosis of large intestine without perforation or abscess without bleeding: Secondary | ICD-10-CM | POA: Insufficient documentation

## 2021-04-20 DIAGNOSIS — Z7983 Long term (current) use of bisphosphonates: Secondary | ICD-10-CM | POA: Diagnosis not present

## 2021-04-20 DIAGNOSIS — K2950 Unspecified chronic gastritis without bleeding: Secondary | ICD-10-CM | POA: Diagnosis not present

## 2021-04-20 DIAGNOSIS — Z882 Allergy status to sulfonamides status: Secondary | ICD-10-CM | POA: Insufficient documentation

## 2021-04-20 DIAGNOSIS — K640 First degree hemorrhoids: Secondary | ICD-10-CM | POA: Diagnosis not present

## 2021-04-20 DIAGNOSIS — Z888 Allergy status to other drugs, medicaments and biological substances status: Secondary | ICD-10-CM | POA: Insufficient documentation

## 2021-04-20 DIAGNOSIS — K222 Esophageal obstruction: Secondary | ICD-10-CM | POA: Diagnosis not present

## 2021-04-20 DIAGNOSIS — K449 Diaphragmatic hernia without obstruction or gangrene: Secondary | ICD-10-CM | POA: Diagnosis not present

## 2021-04-20 DIAGNOSIS — Z7982 Long term (current) use of aspirin: Secondary | ICD-10-CM | POA: Insufficient documentation

## 2021-04-20 DIAGNOSIS — B9681 Helicobacter pylori [H. pylori] as the cause of diseases classified elsewhere: Secondary | ICD-10-CM | POA: Diagnosis not present

## 2021-04-20 DIAGNOSIS — D509 Iron deficiency anemia, unspecified: Secondary | ICD-10-CM | POA: Insufficient documentation

## 2021-04-20 DIAGNOSIS — Z79899 Other long term (current) drug therapy: Secondary | ICD-10-CM | POA: Diagnosis not present

## 2021-04-20 HISTORY — PX: ESOPHAGOGASTRODUODENOSCOPY (EGD) WITH PROPOFOL: SHX5813

## 2021-04-20 HISTORY — PX: COLONOSCOPY WITH PROPOFOL: SHX5780

## 2021-04-20 SURGERY — ESOPHAGOGASTRODUODENOSCOPY (EGD) WITH PROPOFOL
Anesthesia: General

## 2021-04-20 SURGERY — COLONOSCOPY WITH PROPOFOL
Anesthesia: General

## 2021-04-20 MED ORDER — PROPOFOL 500 MG/50ML IV EMUL
INTRAVENOUS | Status: AC
  Filled 2021-04-20: qty 50

## 2021-04-20 MED ORDER — PROPOFOL 500 MG/50ML IV EMUL
INTRAVENOUS | Status: DC | PRN
  Administered 2021-04-20: 150 ug/kg/min via INTRAVENOUS

## 2021-04-20 MED ORDER — PROPOFOL 10 MG/ML IV BOLUS
INTRAVENOUS | Status: DC | PRN
  Administered 2021-04-20: 70 ug via INTRAVENOUS

## 2021-04-20 MED ORDER — PROPOFOL 10 MG/ML IV BOLUS: INTRAVENOUS | Status: DC | PRN

## 2021-04-20 MED ORDER — PROPOFOL 10 MG/ML IV BOLUS
INTRAVENOUS | Status: DC | PRN
  Administered 2021-04-20: 40 mg via INTRAVENOUS

## 2021-04-20 MED ORDER — LIDOCAINE HCL (CARDIAC) PF 100 MG/5ML IV SOSY
PREFILLED_SYRINGE | INTRAVENOUS | Status: DC | PRN
  Administered 2021-04-20: 40 mg via INTRAVENOUS

## 2021-04-20 MED ORDER — IPRATROPIUM-ALBUTEROL 0.5-2.5 (3) MG/3ML IN SOLN: 3.0000 mL | Freq: Once | RESPIRATORY_TRACT | Status: AC

## 2021-04-20 MED ORDER — PROPOFOL 500 MG/50ML IV EMUL
INTRAVENOUS | Status: DC | PRN
  Administered 2021-04-20: 50 ug/kg/min via INTRAVENOUS

## 2021-04-20 MED ORDER — SODIUM CHLORIDE 0.9 % IV SOLN
INTRAVENOUS | Status: DC
  Administered 2021-04-20: 20 mL/h via INTRAVENOUS

## 2021-04-20 MED ORDER — IPRATROPIUM-ALBUTEROL 0.5-2.5 (3) MG/3ML IN SOLN
RESPIRATORY_TRACT | Status: AC
  Filled 2021-04-20: qty 3

## 2021-04-20 MED ORDER — IPRATROPIUM-ALBUTEROL 0.5-2.5 (3) MG/3ML IN SOLN: 3.0000 mL | Freq: Once | RESPIRATORY_TRACT | Status: DC

## 2021-04-20 NOTE — H&P (Signed)
Outpatient short stay form Pre-procedure 04/20/2021 7:43 AM Kristina Miyamoto MD, MPH  Primary Physician: Dr. Ouida Sills  Reason for visit:  IDA  History of present illness:   75 y/o lady with RA here for EGD/Colonoscopy for history of IDA in setting of chronic inflammation. No family history of GI malignancies. History of hysterectomy. No blood thinners. Had normal colonoscopy in 2015.    Current Facility-Administered Medications:    0.9 %  sodium chloride infusion, , Intravenous, Continuous, Kaori Jumper, Hilton Cork, MD, Last Rate: 20 mL/hr at 04/20/21 0704, 20 mL/hr at 04/20/21 0704  Facility-Administered Medications Ordered in Other Encounters:    peginterferon alfa-2b (SYLATRON) injection 198 mcg, 3 mcg/kg, Subcutaneous, Once, Choksi, Janak, MD   peginterferon alfa-2b (SYLATRON) injection 198 mcg, 3 mcg/kg, Subcutaneous, Once, Choksi, Delorise Shiner, MD  Medications Prior to Admission  Medication Sig Dispense Refill Last Dose   alendronate (FOSAMAX) 70 MG tablet Take 70 mg by mouth once a week. Take with a full glass of water on an empty stomach.   04/19/2021   amLODipine (NORVASC) 2.5 MG tablet TAKE ONE (1) TABLET BY MOUTH ONCE DAILY   04/19/2021   aspirin EC 81 MG tablet Take 81 mg by mouth daily.    04/19/2021   ezetimibe (ZETIA) 10 MG tablet Take 10 mg by mouth daily.    2/44/0102   folic acid (FOLVITE) 1 MG tablet Take 1 mg by mouth daily.    04/19/2021   losartan (COZAAR) 100 MG tablet Take 100 mg by mouth daily.   04/19/2021   methotrexate (RHEUMATREX) 2.5 MG tablet Take 12.5 mg by mouth once a week.    04/19/2021   Multiple Vitamin (MULTI-VITAMINS) TABS Take by mouth.   04/19/2021   simvastatin (ZOCOR) 20 MG tablet Take 20 mg by mouth daily.    04/19/2021   Calcium-Vitamin D 600-200 MG-UNIT per tablet Take 1 tablet by mouth 2 (two) times daily.       ferrous sulfate 325 (65 FE) MG EC tablet Take 325 mg by mouth daily with breakfast.       spironolactone (ALDACTONE) 50 MG tablet Take 50 mg by mouth  daily.      tizanidine (ZANAFLEX) 2 MG capsule Take 2 mg by mouth as needed for muscle spasms. (Patient not taking: Reported on 01/04/2021)        Allergies  Allergen Reactions   Rosuvastatin Nausea And Vomiting and Other (See Comments)   Sulfa Antibiotics     Other reaction(s): Unknown     Past Medical History:  Diagnosis Date   Arthritis    Breast cancer ([place])    right    Collagen vascular disease ([place])    RA   Depression    Hypercholesterolemia    Hyperlipidemia    Hypertension    Iron deficiency anemia    Iron deficiency anemia    Neurofibromatosis ([place])    Osteoporosis    Personal history of chemotherapy    Personal history of radiation therapy    Skin cancer    melanoma    Review of systems:  Otherwise negative.    Physical Exam  Gen: Alert, oriented. Appears stated age.  HEENT: PERRLA. Lungs: No respiratory distress CV: RRR Abd: soft, benign, no masses Ext: No edema    Planned procedures: Proceed with EGD/colonoscopy. The patient understands the nature of the planned procedure, indications, risks, alternatives and potential complications including but not limited to bleeding, infection, perforation, damage to internal organs and possible oversedation/side effects from anesthesia. The  patient agrees and gives consent to proceed.  Please refer to procedure notes for findings, recommendations and patient disposition/instructions.     Kristina Miyamoto MD, MPH Gastroenterology 04/20/2021  7:43 AM

## 2021-04-20 NOTE — Anesthesia Preprocedure Evaluation (Signed)
Anesthesia Evaluation  Patient identified by MRN, date of birth, ID band Patient awake    Reviewed: Allergy & Precautions, NPO status , Patient's Chart, lab work & pertinent test results  History of Anesthesia Complications Negative for: history of anesthetic complications  Airway Mallampati: II  TM Distance: >3 FB Neck ROM: Full    Dental  (+) Edentulous Upper, Edentulous Lower   Pulmonary neg pulmonary ROS, neg sleep apnea, neg COPD,    breath sounds clear to auscultation- rhonchi (-) wheezing      Cardiovascular Exercise Tolerance: Good hypertension, Pt. on medications (-) CAD, (-) Past MI, (-) Cardiac Stents and (-) CABG  Rhythm:Regular Rate:Normal - Systolic murmurs and - Diastolic murmurs    Neuro/Psych neg Seizures PSYCHIATRIC DISORDERS Depression negative neurological ROS     GI/Hepatic negative GI ROS, Neg liver ROS,   Endo/Other  negative endocrine ROSneg diabetes  Renal/GU negative Renal ROS     Musculoskeletal  (+) Arthritis ,   Abdominal (+) + obese,   Peds  Hematology  (+) anemia ,   Anesthesia Other Findings Past Medical History: No date: Arthritis No date: Breast cancer (HCC)     Comment:  right  No date: Collagen vascular disease (HCC)     Comment:  RA No date: Depression No date: Hypercholesterolemia No date: Hyperlipidemia No date: Hypertension No date: Iron deficiency anemia No date: Iron deficiency anemia No date: Neurofibromatosis (HCC) No date: Osteoporosis No date: Personal history of chemotherapy No date: Personal history of radiation therapy No date: Skin cancer     Comment:  melanoma   Reproductive/Obstetrics                             Anesthesia Physical  Anesthesia Plan  ASA: 2  Anesthesia Plan: General   Post-op Pain Management:    Induction: Intravenous  PONV Risk Score and Plan: 2 and Propofol infusion  Airway Management Planned:  Natural Airway  Additional Equipment:   Intra-op Plan:   Post-operative Plan:   Informed Consent: I have reviewed the patients History and Physical, chart, labs and discussed the procedure including the risks, benefits and alternatives for the proposed anesthesia with the patient or authorized representative who has indicated his/her understanding and acceptance.     Dental advisory given  Plan Discussed with: CRNA and Anesthesiologist  Anesthesia Plan Comments:         Anesthesia Quick Evaluation

## 2021-04-20 NOTE — Anesthesia Preprocedure Evaluation (Signed)
Anesthesia Evaluation  Patient identified by MRN, date of birth, ID band Patient awake    Reviewed: Allergy & Precautions, NPO status , Patient's Chart, lab work & pertinent test results  History of Anesthesia Complications Negative for: history of anesthetic complications  Airway Mallampati: II  TM Distance: >3 FB Neck ROM: Full    Dental  (+) Edentulous Upper, Edentulous Lower   Pulmonary neg pulmonary ROS, neg sleep apnea, neg COPD,    breath sounds clear to auscultation- rhonchi (-) wheezing      Cardiovascular Exercise Tolerance: Good hypertension, Pt. on medications (-) CAD, (-) Past MI, (-) Cardiac Stents and (-) CABG  Rhythm:Regular Rate:Normal - Systolic murmurs and - Diastolic murmurs    Neuro/Psych neg Seizures PSYCHIATRIC DISORDERS Depression negative neurological ROS     GI/Hepatic negative GI ROS, Neg liver ROS,   Endo/Other  negative endocrine ROSneg diabetes  Renal/GU negative Renal ROS     Musculoskeletal  (+) Arthritis ,   Abdominal (+) + obese,   Peds  Hematology  (+) anemia ,   Anesthesia Other Findings Past Medical History: No date: Arthritis No date: Breast cancer (HCC)     Comment:  right  No date: Collagen vascular disease (HCC)     Comment:  RA No date: Depression No date: Hypercholesterolemia No date: Hyperlipidemia No date: Hypertension No date: Iron deficiency anemia No date: Iron deficiency anemia No date: Neurofibromatosis (HCC) No date: Osteoporosis No date: Personal history of chemotherapy No date: Personal history of radiation therapy No date: Skin cancer     Comment:  melanoma   Reproductive/Obstetrics                             Anesthesia Physical Anesthesia Plan  ASA: 2  Anesthesia Plan: General   Post-op Pain Management:    Induction: Intravenous  PONV Risk Score and Plan: 2 and Propofol infusion  Airway Management Planned:  Natural Airway  Additional Equipment:   Intra-op Plan:   Post-operative Plan:   Informed Consent: I have reviewed the patients History and Physical, chart, labs and discussed the procedure including the risks, benefits and alternatives for the proposed anesthesia with the patient or authorized representative who has indicated his/her understanding and acceptance.     Dental advisory given  Plan Discussed with: CRNA and Anesthesiologist  Anesthesia Plan Comments:         Anesthesia Quick Evaluation

## 2021-04-20 NOTE — Anesthesia Postprocedure Evaluation (Signed)
Anesthesia Post Note  Patient: Kristina Pittman  Procedure(s) Performed: COLONOSCOPY WITH PROPOFOL  Patient location during evaluation: Endoscopy Anesthesia Type: General Level of consciousness: awake and alert and oriented Pain management: pain level controlled Vital Signs Assessment: post-procedure vital signs reviewed and stable Respiratory status: spontaneous breathing, nonlabored ventilation and respiratory function stable Cardiovascular status: blood pressure returned to baseline and stable Postop Assessment: no signs of nausea or vomiting Anesthetic complications: no   No notable events documented.   Last Vitals:  Vitals:   04/20/21 0955 04/20/21 1015  BP:  131/70  Pulse:    Resp:    Temp: (!) 36.3 C   SpO2:      Last Pain:  Vitals:   04/20/21 1015  TempSrc:   PainSc: 0-No pain                 Francina Beery

## 2021-04-20 NOTE — Op Note (Signed)
[facility] Gastroenterology Patient Name: Kristina Pittman Procedure Date: 04/20/2021 7:45 AM MRN: [id number] Account #: [id number] Date of Birth: 04-Sep-1946 Admit Type: Outpatient Age: 75 Room: [facility] ENDO ROOM 3 Gender: Female Note Status: Finalized Procedure:             Upper GI endoscopy Indications:           Iron deficiency anemia Providers:             Andrey Farmer MD, MD Referring MD:          Ocie Cornfield. Ouida Sills MD, MD (Referring MD) Medicines:             Monitored Anesthesia Care Complications:         No immediate complications. Estimated blood loss:                         Minimal. Procedure:             Pre-Anesthesia Assessment:                        - Prior to the procedure, a History and Physical was                         performed, and patient medications and allergies were                         reviewed. The patient is competent. The risks and                         benefits of the procedure and the sedation options and                         risks were discussed with the patient. All questions                         were answered and informed consent was obtained.                         Patient identification and proposed procedure were                         verified by the physician, the nurse, the anesthetist                         and the technician in the endoscopy suite. Mental                         Status Examination: alert and oriented. Airway                         Examination: normal oropharyngeal airway and neck                         mobility. Respiratory Examination: clear to                         auscultation. CV Examination: normal. Prophylactic  Antibiotics: The patient does not require prophylactic                         antibiotics. Prior Anticoagulants: The patient has                         taken no previous anticoagulant or antiplatelet                         agents. ASA Grade  Assessment: III - A patient with                         severe systemic disease. After reviewing the risks and                         benefits, the patient was deemed in satisfactory                         condition to undergo the procedure. The anesthesia                         plan was to use monitored anesthesia care (MAC).                         Immediately prior to administration of medications,                         the patient was re-assessed for adequacy to receive                         sedatives. The heart rate, respiratory rate, oxygen                         saturations, blood pressure, adequacy of pulmonary                         ventilation, and response to care were monitored                         throughout the procedure. The physical status of the                         patient was re-assessed after the procedure.                        After obtaining informed consent, the endoscope was                         passed under direct vision. Throughout the procedure,                         the patient's blood pressure, pulse, and oxygen                         saturations were monitored continuously. The Endoscope                         was introduced through the mouth, and advanced to the  second part of duodenum. The upper GI endoscopy was                         somewhat difficult due to the patient's oxygen                         desaturation. Successful completion of the procedure                         was aided by managing the patient's medical                         instability. The patient tolerated the procedure well. Findings:      A widely patent Schatzki ring was found in the lower third of the       esophagus.      A small hiatal hernia was present.      The entire examined stomach was normal. Biopsies were taken with a cold       forceps for Helicobacter pylori testing. Estimated blood loss was       minimal.      The  examined duodenum was normal. Impression:            - Widely patent Schatzki ring.                        - Small hiatal hernia.                        - Normal stomach. Biopsied.                        - Normal examined duodenum. Recommendation:        - Perform a colonoscopy today. Procedure Code(s):     --- Professional ---                        [phone], Esophagogastroduodenoscopy, flexible,                         transoral; with biopsy, single or multiple Diagnosis Code(s):     --- Professional ---                        K22.2, Esophageal obstruction                        K44.9, Diaphragmatic hernia without obstruction or                         gangrene                        D50.9, Iron deficiency anemia, unspecified CPT copyright 2019 [employer]. All rights reserved. The codes documented in this report are preliminary and upon coder review may  be revised to meet current compliance requirements. Andrey Farmer MD, MD 04/20/2021 9:54:01 AM Number of Addenda: 0 Note Initiated On: 04/20/2021 7:45 AM Estimated Blood Loss:  Estimated blood loss was minimal.      [facility]

## 2021-04-20 NOTE — Transfer of Care (Signed)
Immediate Anesthesia Transfer of Care Note  Patient: Kristina Pittman  Procedure(s) Performed: ESOPHAGOGASTRODUODENOSCOPY (EGD) WITH PROPOFOL COLONOSCOPY WITH PROPOFOL  Patient Location: PACU  Anesthesia Type:General  Level of Consciousness: awake  Airway & Oxygen Therapy: Patient connected to face mask oxygen  Post-op Assessment: Report given to RN and Post -op Vital signs reviewed and stable  Post vital signs: stable  Last Vitals:  Vitals Value Taken Time  BP 131/74 04/20/21 0823  Temp 37.1 C 04/20/21 0820  Pulse 92 04/20/21 0828  Resp 29 04/20/21 0828  SpO2 100 % 04/20/21 0828  Vitals shown include unvalidated device data.  Last Pain:  Vitals:   04/20/21 0820  TempSrc: Temporal  PainSc:          Complications: No notable events documented.

## 2021-04-20 NOTE — Transfer of Care (Signed)
Immediate Anesthesia Transfer of Care Note  Patient: Kristina Pittman  Procedure(s) Performed: COLONOSCOPY WITH PROPOFOL  Patient Location: PACU  Anesthesia Type:General  Level of Consciousness: awake  Airway & Oxygen Therapy: Patient Spontanous Breathing  Post-op Assessment: Report given to RN and Post -op Vital signs reviewed and stable  Post vital signs: stable  Last Vitals:  Vitals Value Taken Time  BP 111/58 04/20/21 0954  Temp    Pulse 94 04/20/21 0954  Resp 18 04/20/21 0954  SpO2 100 % 04/20/21 0954  Vitals shown include unvalidated device data.  Last Pain:  Vitals:   04/20/21 0820  TempSrc: Temporal  PainSc:          Complications: No notable events documented.

## 2021-04-20 NOTE — Anesthesia Postprocedure Evaluation (Signed)
Anesthesia Post Note  Patient: Kristina Pittman  Procedure(s) Performed: ESOPHAGOGASTRODUODENOSCOPY (EGD) WITH PROPOFOL  Patient location during evaluation: Endoscopy Anesthesia Type: General Level of consciousness: awake and alert and oriented Pain management: pain level controlled Vital Signs Assessment: post-procedure vital signs reviewed and stable Respiratory status: spontaneous breathing, nonlabored ventilation and respiratory function stable Cardiovascular status: blood pressure returned to baseline and stable Postop Assessment: no signs of nausea or vomiting Anesthetic complications: no   No notable events documented.   Last Vitals:  Vitals:   04/20/21 0955 04/20/21 1015  BP:  131/70  Pulse:    Resp:    Temp: (!) 36.3 C   SpO2:      Last Pain:  Vitals:   04/20/21 1015  TempSrc:   PainSc: 0-No pain                 Annelyse Rey

## 2021-04-20 NOTE — Interval H&P Note (Signed)
History and Physical Interval Note:  04/20/2021 7:46 AM  Kristina Pittman  has presented today for surgery, with the diagnosis of iron deficiency.  The various methods of treatment have been discussed with the patient and family. After consideration of risks, benefits and other options for treatment, the patient has consented to  Procedure(s): ESOPHAGOGASTRODUODENOSCOPY (EGD) WITH PROPOFOL (N/A) COLONOSCOPY WITH PROPOFOL (N/A) as a surgical intervention.  The patient's history has been reviewed, patient examined, no change in status, stable for surgery.  I have reviewed the patient's chart and labs.  Questions were answered to the patient's satisfaction.     Lesly Rubenstein  Ok to proceed with EGD/Colonoscopy

## 2021-04-23 ENCOUNTER — Encounter: Payer: Self-pay | Admitting: Gastroenterology

## 2021-04-23 LAB — SURGICAL PATHOLOGY

## 2021-04-23 NOTE — Op Note (Signed)
[facility] Gastroenterology Patient Name: Declan Adamson Procedure Date: 04/20/2021 9:25 AM MRN: [id number] Account #: [id number] Date of Birth: [birth year] Admit Type: Outpatient Age: 75 Room: [facility] ENDO ROOM 3 Gender: Female Note Status: Finalized Procedure:             Colonoscopy Indications:           Iron deficiency anemia Providers:             Andrey Farmer MD, MD Referring MD:          Ocie Cornfield. Ouida Sills MD, MD (Referring MD) Medicines:             Monitored Anesthesia Care Complications:         No immediate complications. Procedure:             Pre-Anesthesia Assessment:                        - Prior to the procedure, a History and Physical was                         performed, and patient medications and allergies were                         reviewed. The patient is competent. The risks and                         benefits of the procedure and the sedation options and                         risks were discussed with the patient. All questions                         were answered and informed consent was obtained.                         Patient identification and proposed procedure were                         verified by the physician, the nurse, the anesthetist                         and the technician in the endoscopy suite. Mental                         Status Examination: alert and oriented. Airway                         Examination: normal oropharyngeal airway and neck                         mobility. Respiratory Examination: clear to                         auscultation. CV Examination: normal. Prophylactic                         Antibiotics: The patient does not require prophylactic  antibiotics. Prior Anticoagulants: The patient has                         taken no previous anticoagulant or antiplatelet                         agents. ASA Grade Assessment: III - A patient with                         severe  systemic disease. After reviewing the risks and                         benefits, the patient was deemed in satisfactory                         condition to undergo the procedure. The anesthesia                         plan was to use monitored anesthesia care (MAC).                         Immediately prior to administration of medications,                         the patient was re-assessed for adequacy to receive                         sedatives. The heart rate, respiratory rate, oxygen                         saturations, blood pressure, adequacy of pulmonary                         ventilation, and response to care were monitored                         throughout the procedure. The physical status of the                         patient was re-assessed after the procedure.                        After obtaining informed consent, the colonoscope was                         passed under direct vision. Throughout the procedure,                         the patient's blood pressure, pulse, and oxygen                         saturations were monitored continuously. The                         Colonoscope was introduced through the anus and                         advanced to the the cecum, identified by appendiceal  orifice and ileocecal valve. The colonoscopy was                         performed without difficulty. The patient tolerated                         the procedure well. The quality of the bowel                         preparation was adequate to identify polyps. Findings:      The perianal and digital rectal examinations were normal.      A few small-mouthed diverticula were found in the descending colon.      Internal hemorrhoids were found during retroflexion. The hemorrhoids       were Grade I (internal hemorrhoids that do not prolapse).      The exam was otherwise without abnormality on direct and retroflexion       views. Impression:            -  Diverticulosis in the descending colon.                        - Internal hemorrhoids.                        - The examination was otherwise normal on direct and                         retroflexion views.                        - No specimens collected. Recommendation:        - Discharge patient to home.                        - Resume previous diet.                        - Continue present medications.                        - Repeat colonoscopy is not recommended due to current                         age (62 years or older) for screening purposes.                        - Return to referring physician as previously                         scheduled. Procedure Code(s):     --- Professional ---                        [phone], Colonoscopy, flexible; diagnostic, including                         collection of specimen(s) by brushing or washing, when                         performed (separate procedure) Diagnosis Code(s):     --- Professional ---  K64.0, First degree hemorrhoids                        D50.9, Iron deficiency anemia, unspecified                        K57.30, Diverticulosis of large intestine without                         perforation or abscess without bleeding CPT copyright 2019 American [employer] Association. All rights reserved. The codes documented in this report are preliminary and upon coder review may  be revised to meet current compliance requirements. Andrey Farmer MD, MD 04/20/2021 9:55:54 AM Number of Addenda: 0 Note Initiated On: 04/20/2021 9:25 AM Scope Withdrawal Time: 0 hours 6 minutes 56 seconds  Total Procedure Duration: 0 hours 9 minutes 54 seconds  Estimated Blood Loss:  Estimated blood loss: none.      [facility]

## 2021-11-08 ENCOUNTER — Other Ambulatory Visit: Payer: Self-pay | Admitting: Internal Medicine

## 2021-11-08 DIAGNOSIS — Z1231 Encounter for screening mammogram for malignant neoplasm of breast: Secondary | ICD-10-CM

## 2021-12-26 ENCOUNTER — Other Ambulatory Visit: Payer: Self-pay | Admitting: *Deleted

## 2021-12-26 DIAGNOSIS — R7989 Other specified abnormal findings of blood chemistry: Secondary | ICD-10-CM

## 2021-12-26 DIAGNOSIS — D509 Iron deficiency anemia, unspecified: Secondary | ICD-10-CM

## 2021-12-31 ENCOUNTER — Ambulatory Visit
Admission: RE | Admit: 2021-12-31 | Discharge: 2021-12-31 | Disposition: A | Discharge status: Home / Self Care | Payer: Medicare Other | Source: Ambulatory Visit | Attending: Internal Medicine | Admitting: Internal Medicine

## 2021-12-31 ENCOUNTER — Other Ambulatory Visit: Payer: Self-pay

## 2021-12-31 DIAGNOSIS — Z1231 Encounter for screening mammogram for malignant neoplasm of breast: Secondary | ICD-10-CM | POA: Insufficient documentation

## 2022-01-03 ENCOUNTER — Other Ambulatory Visit: Payer: Self-pay | Admitting: Internal Medicine

## 2022-01-03 ENCOUNTER — Ambulatory Visit: Payer: Medicare Other | Admitting: Oncology

## 2022-01-03 ENCOUNTER — Other Ambulatory Visit: Payer: Medicare Other

## 2022-01-03 DIAGNOSIS — R928 Other abnormal and inconclusive findings on diagnostic imaging of breast: Secondary | ICD-10-CM

## 2022-01-03 DIAGNOSIS — N6489 Other specified disorders of breast: Secondary | ICD-10-CM

## 2022-01-04 ENCOUNTER — Encounter: Payer: Self-pay | Admitting: Oncology

## 2022-01-04 ENCOUNTER — Inpatient Hospital Stay: Payer: Medicare Other | Attending: Oncology

## 2022-01-04 ENCOUNTER — Other Ambulatory Visit: Payer: Self-pay

## 2022-01-04 ENCOUNTER — Inpatient Hospital Stay (HOSPITAL_BASED_OUTPATIENT_CLINIC_OR_DEPARTMENT_OTHER): Payer: Medicare Other | Admitting: Oncology

## 2022-01-04 VITALS — BP 120/72 | HR 89 | Temp 97.7°F | Resp 18 | Wt 163.2 lb

## 2022-01-04 DIAGNOSIS — D509 Iron deficiency anemia, unspecified: Secondary | ICD-10-CM

## 2022-01-04 DIAGNOSIS — R7989 Other specified abnormal findings of blood chemistry: Secondary | ICD-10-CM | POA: Insufficient documentation

## 2022-01-04 DIAGNOSIS — R928 Other abnormal and inconclusive findings on diagnostic imaging of breast: Secondary | ICD-10-CM

## 2022-01-04 DIAGNOSIS — Z9221 Personal history of antineoplastic chemotherapy: Secondary | ICD-10-CM | POA: Diagnosis not present

## 2022-01-04 DIAGNOSIS — D0352 Melanoma in situ of breast (skin) (soft tissue): Secondary | ICD-10-CM

## 2022-01-04 DIAGNOSIS — M069 Rheumatoid arthritis, unspecified: Secondary | ICD-10-CM | POA: Insufficient documentation

## 2022-01-04 DIAGNOSIS — C439 Malignant melanoma of skin, unspecified: Secondary | ICD-10-CM | POA: Diagnosis not present

## 2022-01-04 DIAGNOSIS — Z85820 Personal history of malignant melanoma of skin: Secondary | ICD-10-CM | POA: Diagnosis present

## 2022-01-04 DIAGNOSIS — Z923 Personal history of irradiation: Secondary | ICD-10-CM | POA: Insufficient documentation

## 2022-01-04 LAB — CBC WITH DIFFERENTIAL/PLATELET
Abs Immature Granulocytes: 0.05 10*3/uL (ref 0.00–0.07)
Basophils Absolute: 0 10*3/uL (ref 0.0–0.1)
Basophils Relative: 1 %
Eosinophils Absolute: 0 10*3/uL (ref 0.0–0.5)
Eosinophils Relative: 1 %
HCT: 38.1 % (ref 36.0–46.0)
Hemoglobin: 12.2 g/dL (ref 12.0–15.0)
Immature Granulocytes: 1 %
Lymphocytes Relative: 15 %
Lymphs Abs: 1 10*3/uL (ref 0.7–4.0)
MCH: 26 pg (ref 26.0–34.0)
MCHC: 32 g/dL (ref 30.0–36.0)
MCV: 81.1 fL (ref 80.0–100.0)
Monocytes Absolute: 0.6 10*3/uL (ref 0.1–1.0)
Monocytes Relative: 9 %
Neutro Abs: 4.8 10*3/uL (ref 1.7–7.7)
Neutrophils Relative %: 73 %
Platelets: 216 10*3/uL (ref 150–400)
RBC: 4.7 MIL/uL (ref 3.87–5.11)
RDW: 16.2 % — ABNORMAL HIGH (ref 11.5–15.5)
WBC: 6.4 10*3/uL (ref 4.0–10.5)
nRBC: 0 % (ref 0.0–0.2)

## 2022-01-04 LAB — COMPREHENSIVE METABOLIC PANEL
ALT: 11 U/L (ref 0–44)
AST: 16 U/L (ref 15–41)
Albumin: 4.1 g/dL (ref 3.5–5.0)
Alkaline Phosphatase: 52 U/L (ref 38–126)
Anion gap: 7 (ref 5–15)
BUN: 18 mg/dL (ref 8–23)
CO2: 27 mmol/L (ref 22–32)
Calcium: 9.7 mg/dL (ref 8.9–10.3)
Chloride: 102 mmol/L (ref 98–111)
Creatinine, Ser: 0.79 mg/dL (ref 0.44–1.00)
GFR, Estimated: >60 mL/min (ref >60)
Glucose, Bld: 114 mg/dL — ABNORMAL HIGH (ref 70–99)
Potassium: 4.3 mmol/L (ref 3.5–5.1)
Sodium: 136 mmol/L (ref 135–145)
Total Bilirubin: 0.3 mg/dL (ref 0.3–1.2)
Total Protein: 8 g/dL (ref 6.5–8.1)

## 2022-01-04 LAB — IRON AND TIBC
Iron: 64 ug/dL (ref 28–170)
Saturation Ratios: 20 % (ref 10.4–31.8)
TIBC: 316 ug/dL (ref 250–450)
UIBC: 252 ug/dL

## 2022-01-04 LAB — FERRITIN: Ferritin: 1068 ng/mL — ABNORMAL HIGH (ref 11–307)

## 2022-01-04 NOTE — Progress Notes (Signed)
Patient here for follow up. No new concerns voiced.  °

## 2022-01-04 NOTE — Progress Notes (Signed)
Hematology/Oncology Progress note [phone] [phone]      Clinic Day: 01/04/2022   Referring physician: Kirk Ruths, MD  Chief Complaint: Kristina Pittman is a 76 y.o. 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  There was ulceration present.  Invasive melanoma involved the peripheral and dep margins.  Clark level was V.  There was 12 mitosis per mm2.  There were tumor infiltrating lymphocytes.  There was no lymphvascular or perineural invasion.  Pathologic stage was pT4bNxMx.  PET scan on 03/17/2014 revealed no residual malignancy or metastatic disease.  Head MRI on 03/22/2014 revealed no acute intracranial findings.     03/24/2014 She underwent modified right radical mastectomy and lymph node dissection .  Pathology revealed no residual melanoma.  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  There were 17 lymph nodes negative for malignancy.  BRAF V600E was negative.  Final pathologic stage was pT4bN0.  She received right chest wall and peripheral lymphatic radiation.  She received peg-interferon alfa-2b (completed 06/2015).  She has undergone yearly PET scans (11/09/2014, 04/23/2016, 05/15/2017) and CT scans (05/14/2018, 05/10/2019) with no evidence of recurrent disease.  Chest, abdomen, and pelvis CT on 05/10/2019 revealed no findings to suggest metastatic disease.  Chest abdomen and pelvis CT on 05/10/2020 revealed no evidence of metastatic disease to the chest, abdomen or pelvis. There was cylindrical  bronchiectasis and scarring in the RIGHT middle and bilateral lower lobes as before. Along the LEFT flank, an ovoid partially calcified fatty density area measured 5.1 x 3.9 cm and was unchanged since the PET exam from 2018 perhaps even slightly smaller as it measured approximately 5.8 x 4.7 cm on the prior exam. This may represent an area of fat necrosis and was likely smaller than in 2018.  She has rheumatoid arthritis and is on methotrexate.  Enbrel was discontinued in 2015.  She is followed by Dr Jefm Bryant.   INTERVAL HISTORY Kristina Pittman is a 76 y.o. female who has above history reviewed by me today presents for follow up visit  She reports feeling well. No new complaints.  Denies any breast concerns.  Her RA symptoms are well controlled with MTX. She follows up with rheumatoogy.    Review of Systems  Constitutional:  Negative for appetite change, chills, fatigue and fever.  HENT:   Negative for hearing loss and voice change.   Eyes:  Negative for eye problems.  Respiratory:  Negative for chest tightness and cough.   Cardiovascular:  Negative for chest pain.  Gastrointestinal:  Negative for abdominal distention, abdominal pain and blood in stool.  Endocrine: Negative for hot flashes.  Genitourinary:  Negative for difficulty urinating and frequency.   Musculoskeletal:  Positive for arthralgias.  Skin:  Negative for itching and rash.  Neurological:  Negative for extremity weakness.  Hematological:  Negative for adenopathy.  Psychiatric/Behavioral:  Negative for confusion.     Past Medical History:  Diagnosis Date   Breast cancer ([place])    right    Collagen vascular disease ([place])    RA  Depression    Hypercholesterolemia    Hyperlipidemia    Hypertension    Iron deficiency anemia    Iron deficiency anemia    Neurofibromatosis ([place])    Osteoporosis    Personal history of chemotherapy    Personal history of radiation therapy    rheumatoid Arthritis    Skin cancer     melanoma    Past Surgical History:  Procedure Laterality Date   ABDOMINAL HYSTERECTOMY     COLONOSCOPY WITH PROPOFOL N/A 04/20/2021   Procedure: COLONOSCOPY WITH PROPOFOL;  Surgeon: Lesly Rubenstein, MD;  Location: [facility] ENDOSCOPY;  Service: Endoscopy;  Laterality: N/A;   ESOPHAGOGASTRODUODENOSCOPY (EGD) WITH PROPOFOL N/A 04/20/2021   Procedure: ESOPHAGOGASTRODUODENOSCOPY (EGD) WITH PROPOFOL;  Surgeon: Lesly Rubenstein, MD;  Location: [facility] ENDOSCOPY;  Service: Endoscopy;  Laterality: N/A;   Excision  right breast mass     MASTECTOMY Right 2015   TONSILLECTOMY      Family History  Problem Relation Age of Onset   Breast cancer Neg Hx     Social History:  reports that she has never smoked. She has never used smokeless tobacco. She reports that she does not drink alcohol and does not use drugs.   Allergies:  Allergies  Allergen Reactions   Rosuvastatin Nausea And Vomiting and Other (See Comments)   Sulfa Antibiotics     Other reaction(s): Unknown   Current Medications: Current Outpatient Medications  Medication Sig Dispense Refill   amLODipine (NORVASC) 2.5 MG tablet TAKE ONE (1) TABLET BY MOUTH ONCE DAILY     aspirin EC 81 MG tablet Take 81 mg by mouth daily.      Calcium-Vitamin D 600-200 MG-UNIT per tablet Take 1 tablet by mouth 2 (two) times daily.      ezetimibe (ZETIA) 10 MG tablet Take 10 mg by mouth daily.      ferrous sulfate 325 (65 FE) MG EC tablet Take 325 mg by mouth daily with breakfast.      folic acid (FOLVITE) 1 MG tablet Take 1 mg by mouth daily.      losartan (COZAAR) 100 MG tablet Take 100 mg by mouth daily.     methotrexate (RHEUMATREX) 2.5 MG tablet Take 12.5 mg by mouth once a week.      Multiple Vitamin (MULTI-VITAMINS) TABS Take by mouth.     simvastatin (ZOCOR) 20 MG tablet Take 20 mg by mouth daily.      spironolactone (ALDACTONE) 50 MG tablet Take 50 mg by mouth daily.     No current facility-administered medications for this visit.    Facility-Administered Medications Ordered in Other Visits  Medication Dose Route Frequency Provider Last Rate Last Admin   peginterferon alfa-2b (SYLATRON) injection 198 mcg  3 mcg/kg Subcutaneous Once Choksi, Delorise Shiner, MD       peginterferon alfa-2b (SYLATRON) injection 198 mcg  3 mcg/kg Subcutaneous Once Forest Gleason, MD         Performance status (ECOG): 0  Vital Signs Blood pressure 120/72, pulse 89, temperature 97.7 F (36.5 C), resp. rate 18, weight 163 lb 3.2 oz (74 kg).  Physical Exam Constitutional:      General: She is not in acute distress.    Appearance: She is not diaphoretic.  HENT:     Head: Normocephalic and atraumatic.     Nose: Nose normal.     Mouth/Throat:     Pharynx: No oropharyngeal exudate.  Eyes:     General: No scleral icterus.    Pupils: Pupils are  equal, round, and reactive to light.  Cardiovascular:     Rate and Rhythm: Normal rate and regular rhythm.     Heart sounds: No murmur heard. Pulmonary:     Effort: Pulmonary effort is normal. No respiratory distress.     Breath sounds: No rales.  Chest:     Chest wall: No tenderness.  Abdominal:     General: There is no distension.     Palpations: Abdomen is soft.     Tenderness: There is no abdominal tenderness.  Musculoskeletal:        General: Normal range of motion.     Cervical back: Normal range of motion and neck supple.  Skin:    General: Skin is warm and dry.     Findings: No erythema.  Neurological:     Mental Status: She is alert and oriented to person, place, and time.     Cranial Nerves: No cranial nerve deficit.     Motor: No abnormal muscle tone.     Coordination: Coordination normal.  Psychiatric:        Mood and Affect: Affect normal.    Appointment on 01/04/2022  Component Date Value Ref Range Status   Iron 01/04/2022 64  28 - 170 ug/dL Final   TIBC 85/42/4269 316  250 - 450 ug/dL Final   Saturation Ratios 01/04/2022 20  10.4 - 31.8 % Final   UIBC 01/04/2022 252  ug/dL  Final   Performed at [facility], [address] [place], [place] [address]   Ferritin 01/04/2022 1,068 (H)  11 - 307 ng/mL Final   Performed at [facility], [address] [place], [place] [address]   WBC 01/04/2022 6.4  4.0 - 10.5 K/uL Final   RBC 01/04/2022 4.70  3.87 - 5.11 MIL/uL Final   Hemoglobin 01/04/2022 12.2  12.0 - 15.0 g/dL Final   HCT 13/31/6919 38.1  36.0 - 46.0 % Final   MCV 01/04/2022 81.1  80.0 - 100.0 fL Final   MCH 01/04/2022 26.0  26.0 - 34.0 pg Final   MCHC 01/04/2022 32.0  30.0 - 36.0 g/dL Final   RDW 07/83/3152 16.2 (H)  11.5 - 15.5 % Final   Platelets 01/04/2022 216  150 - 400 K/uL Final   nRBC 01/04/2022 0.0  0.0 - 0.2 % Final   Neutrophils Relative % 01/04/2022 73  % Final   Neutro Abs 01/04/2022 4.8  1.7 - 7.7 K/uL Final   Lymphocytes Relative 01/04/2022 15  % Final   Lymphs Abs 01/04/2022 1.0  0.7 - 4.0 K/uL Final   Monocytes Relative 01/04/2022 9  % Final   Monocytes Absolute 01/04/2022 0.6  0.1 - 1.0 K/uL Final   Eosinophils Relative 01/04/2022 1  % Final   Eosinophils Absolute 01/04/2022 0.0  0.0 - 0.5 K/uL Final   Basophils Relative 01/04/2022 1  % Final   Basophils Absolute 01/04/2022 0.0  0.0 - 0.1 K/uL Final   Immature Granulocytes 01/04/2022 1  % Final   Abs Immature Granulocytes 01/04/2022 0.05  0.00 - 0.07 K/uL Final   Performed at [facility] [place], [address] [place], [place] [address]   Sodium 01/04/2022 136  135 - 145 mmol/L Final   Potassium 01/04/2022 4.3  3.5 - 5.1 mmol/L Final   Chloride 01/04/2022 102  98 - 111 mmol/L Final   CO2 01/04/2022 27  22 - 32 mmol/L Final   Glucose, Bld 01/04/2022 114 (H)  70 - 99 mg/dL Final  Glucose reference range applies only to samples taken after fasting for at least 8 hours.   BUN 01/04/2022 18  8 - 23 mg/dL Final   Creatinine, Ser 01/04/2022 0.79  0.44 - 1.00 mg/dL Final   Calcium 01/04/2022 9.7  8.9 - 10.3 mg/dL Final   Total Protein 01/04/2022 8.0  6.5 - 8.1 g/dL  Final   Albumin 01/04/2022 4.1  3.5 - 5.0 g/dL Final   AST 01/04/2022 16  15 - 41 U/L Final   ALT 01/04/2022 11  0 - 44 U/L Final   Alkaline Phosphatase 01/04/2022 52  38 - 126 U/L Final   Total Bilirubin 01/04/2022 0.3  0.3 - 1.2 mg/dL Final   GFR, Estimated 01/04/2022 >60  >60 mL/min Final   Comment: (NOTE) Calculated using the CKD-EPI Creatinine Equation (2021)    Anion gap 01/04/2022 7  5 - 15 Final   Performed at [facility], [address], [place], [place] [address]    Assessment/Plan 1. Elevated ferritin   2. Abnormal mammogram of left breast   3. [name] ([place])    # History of Stage II melanoma of the right breast -2015 S/p excision, lymph node dissection, radiation, and adjuvant interferon. Chest, abdomen, and pelvic CT on 05/10/2020 revealed no evidence of metastatic disease. Recommend annual dermatology examination.  12/31/21 unilateral left mammogram showed possible asymmetry in the left breast. Recommend diagnostic mammogram- scheduled.   # Elevated Ferritin,  Normal iron saturation. Likely due to chronic inflammation due to RA Check hemochromatosis mutation PCR at next visit.   7.   RTC in 1 year for MD assessment, labs (CBC with diff, CMP, ferritin, iron studies), and review of interval mammogram.  I discussed the assessment and treatment plan with the patient.  The patient was provided an opportunity to ask questions and all were answered.  The patient agreed with the plan and demonstrated an understanding of the instructions.  The patient was advised to call back if the symptoms worsen or if the condition fails to improve as anticipated.  Earlie Server, MD, PhD [facility] Hematology Oncology 01/05/2022

## 2022-01-07 ENCOUNTER — Telehealth: Payer: Self-pay

## 2022-01-07 NOTE — Telephone Encounter (Signed)
-----   Message from Earlie Server, MD sent at 01/05/2022  2:20 PM EST ----- ?Please let her know that I recommend her to check one more blood work.  ?Ordered hemochromatosis testing.  ?

## 2022-01-07 NOTE — Telephone Encounter (Signed)
Called and informed patient of additional labs Dr. Tasia Catchings ordered for her to have done. Please add lab encounter for patient on 01/15/2022 and notify patient. Patient is aware.  Thanks ?

## 2022-01-15 ENCOUNTER — Inpatient Hospital Stay: Payer: Medicare Other

## 2022-01-15 ENCOUNTER — Ambulatory Visit
Admission: RE | Admit: 2022-01-15 | Discharge: 2022-01-15 | Disposition: A | Discharge status: Home / Self Care | Payer: Medicare Other | Source: Ambulatory Visit | Attending: Internal Medicine | Admitting: Internal Medicine

## 2022-01-15 ENCOUNTER — Other Ambulatory Visit: Payer: Self-pay

## 2022-01-15 DIAGNOSIS — R928 Other abnormal and inconclusive findings on diagnostic imaging of breast: Secondary | ICD-10-CM

## 2022-01-15 DIAGNOSIS — N6489 Other specified disorders of breast: Secondary | ICD-10-CM | POA: Diagnosis present

## 2022-01-15 DIAGNOSIS — R7989 Other specified abnormal findings of blood chemistry: Secondary | ICD-10-CM

## 2022-01-15 DIAGNOSIS — Z85820 Personal history of malignant melanoma of skin: Secondary | ICD-10-CM | POA: Diagnosis not present

## 2022-01-21 LAB — HEMOCHROMATOSIS DNA-PCR(C282Y,H63D)

## 2022-12-12 ENCOUNTER — Other Ambulatory Visit: Payer: Self-pay | Admitting: Internal Medicine

## 2022-12-12 DIAGNOSIS — Z1231 Encounter for screening mammogram for malignant neoplasm of breast: Secondary | ICD-10-CM

## 2023-01-06 ENCOUNTER — Other Ambulatory Visit: Payer: Medicare Other

## 2023-01-06 ENCOUNTER — Inpatient Hospital Stay: Payer: Medicare Other

## 2023-01-06 ENCOUNTER — Ambulatory Visit: Payer: Medicare Other | Admitting: Oncology

## 2023-01-06 ENCOUNTER — Inpatient Hospital Stay: Payer: Medicare Other | Attending: Oncology | Admitting: Oncology

## 2023-01-06 ENCOUNTER — Encounter: Payer: Self-pay | Admitting: Oncology

## 2023-01-06 VITALS — BP 121/75 | HR 79 | Temp 97.4°F | Resp 18 | Wt 161.9 lb

## 2023-01-06 DIAGNOSIS — R7989 Other specified abnormal findings of blood chemistry: Secondary | ICD-10-CM

## 2023-01-06 DIAGNOSIS — Z923 Personal history of irradiation: Secondary | ICD-10-CM | POA: Insufficient documentation

## 2023-01-06 DIAGNOSIS — C439 Malignant melanoma of skin, unspecified: Secondary | ICD-10-CM

## 2023-01-06 DIAGNOSIS — C4352 Malignant melanoma of skin of breast: Secondary | ICD-10-CM | POA: Diagnosis not present

## 2023-01-06 DIAGNOSIS — Z90710 Acquired absence of both cervix and uterus: Secondary | ICD-10-CM | POA: Diagnosis not present

## 2023-01-06 DIAGNOSIS — M069 Rheumatoid arthritis, unspecified: Secondary | ICD-10-CM | POA: Insufficient documentation

## 2023-01-06 DIAGNOSIS — Z9221 Personal history of antineoplastic chemotherapy: Secondary | ICD-10-CM | POA: Insufficient documentation

## 2023-01-06 DIAGNOSIS — Z9011 Acquired absence of right breast and nipple: Secondary | ICD-10-CM | POA: Diagnosis not present

## 2023-01-06 DIAGNOSIS — Z85820 Personal history of malignant melanoma of skin: Secondary | ICD-10-CM | POA: Diagnosis not present

## 2023-01-06 LAB — CBC WITH DIFFERENTIAL (CANCER CENTER ONLY)
Abs Immature Granulocytes: 0.03 10*3/uL (ref 0.00–0.07)
Basophils Absolute: 0 10*3/uL (ref 0.0–0.1)
Basophils Relative: 0 %
Eosinophils Absolute: 0.1 10*3/uL (ref 0.0–0.5)
Eosinophils Relative: 1 %
HCT: 36.7 % (ref 36.0–46.0)
Hemoglobin: 11.7 g/dL — ABNORMAL LOW (ref 12.0–15.0)
Immature Granulocytes: 1 %
Lymphocytes Relative: 13 %
Lymphs Abs: 0.8 10*3/uL (ref 0.7–4.0)
MCH: 25.7 pg — ABNORMAL LOW (ref 26.0–34.0)
MCHC: 31.9 g/dL (ref 30.0–36.0)
MCV: 80.7 fL (ref 80.0–100.0)
Monocytes Absolute: 0.5 10*3/uL (ref 0.1–1.0)
Monocytes Relative: 9 %
Neutro Abs: 4.5 10*3/uL (ref 1.7–7.7)
Neutrophils Relative %: 76 %
Platelet Count: 212 10*3/uL (ref 150–400)
RBC: 4.55 MIL/uL (ref 3.87–5.11)
RDW: 16.5 % — ABNORMAL HIGH (ref 11.5–15.5)
WBC Count: 5.9 10*3/uL (ref 4.0–10.5)
nRBC: 0 % (ref 0.0–0.2)

## 2023-01-06 LAB — CMP (CANCER CENTER ONLY)
ALT: 15 U/L (ref 0–44)
AST: 17 U/L (ref 15–41)
Albumin: 4.2 g/dL (ref 3.5–5.0)
Alkaline Phosphatase: 46 U/L (ref 38–126)
Anion gap: 10 (ref 5–15)
BUN: 13 mg/dL (ref 8–23)
CO2: 25 mmol/L (ref 22–32)
Calcium: 9.5 mg/dL (ref 8.9–10.3)
Chloride: 102 mmol/L (ref 98–111)
Creatinine: 0.75 mg/dL (ref 0.44–1.00)
GFR, Estimated: >60 mL/min (ref >60)
Glucose, Bld: 119 mg/dL — ABNORMAL HIGH (ref 70–99)
Potassium: 3.8 mmol/L (ref 3.5–5.1)
Sodium: 137 mmol/L (ref 135–145)
Total Bilirubin: 0.6 mg/dL (ref 0.3–1.2)
Total Protein: 7.7 g/dL (ref 6.5–8.1)

## 2023-01-06 LAB — IRON AND TIBC
Iron: 80 ug/dL (ref 28–170)
Saturation Ratios: 26 % (ref 10.4–31.8)
TIBC: 309 ug/dL (ref 250–450)
UIBC: 229 ug/dL

## 2023-01-06 LAB — FERRITIN: Ferritin: 693 ng/mL — ABNORMAL HIGH (ref 11–307)

## 2023-01-06 NOTE — Assessment & Plan Note (Addendum)
#   Elevated Ferritin,  Normal iron saturation. Likely due to chronic inflammation due to RA Negative  hemochromatosis mutation on PCR  Stop oral iron supplementation.

## 2023-01-06 NOTE — Assessment & Plan Note (Addendum)
#   History of Stage II melanoma of the right breast -2015 S/p excision, lymph node dissection, radiation, and adjuvant interferon.  Recommend annual dermatology examination.  Recommend diagnostic mammogram- scheduled.

## 2023-01-06 NOTE — Progress Notes (Signed)
Hematology/Oncology Progress note [phone] [phone]     Chief Complaint: Kristina Pittman is a 77 y.o. female presents for follow-up of stage IIC melanoma s/p resection and adjuvant therapy   ASSESSMENT & PLAN:   Malignant melanoma of breast ([place]) # History of Stage II melanoma of the right breast -2015 S/p excision, lymph node dissection, radiation, and adjuvant interferon.  Recommend annual dermatology examination.  Recommend diagnostic mammogram- scheduled.   Elevated ferritin # Elevated Ferritin,  Normal iron saturation. Likely due to chronic inflammation due to RA Negative  hemochromatosis mutation on PCR  Stop oral iron supplementation.   Orders Placed This Encounter  Procedures   CBC with Differential ([place] Only)    Standing Status:   Future    Number of Occurrences:   1    Standing Expiration Date:   01/06/2024   CMP ([place] only)    Standing Status:   Future    Number of Occurrences:   1    Standing Expiration Date:   01/06/2024   Ferritin    Standing Status:   Future    Number of Occurrences:   1    Standing Expiration Date:   01/06/2024   Iron and TIBC    Standing Status:   Future    Number of Occurrences:   1    Standing Expiration Date:   01/06/2024   Follow up in 1 year All questions were answered. The patient knows to call the clinic with any problems, questions or concerns.  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  There was ulceration present.  Invasive melanoma  involved the peripheral and dep margins.  Clark level was V.  There was 12 mitosis per mm2.  There were tumor infiltrating lymphocytes.  There was no lymphvascular or perineural invasion.  Pathologic stage was pT4bNxMx.  PET scan on 03/17/2014 revealed no residual malignancy or metastatic disease.  Head MRI on 03/22/2014 revealed no acute intracranial findings.     03/24/2014 She underwent modified right radical mastectomy and lymph node dissection .  Pathology revealed no residual melanoma.  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  There were 17 lymph nodes negative for malignancy.  BRAF V600E was negative.  Final pathologic stage was pT4bN0.  She received right chest wall and peripheral lymphatic radiation.  She received peg-interferon alfa-2b (completed 06/2015).  She has undergone yearly PET scans (11/09/2014, 04/23/2016, 05/15/2017) and CT scans (05/14/2018, 05/10/2019) with no evidence of recurrent disease.  Chest, abdomen, and pelvis CT on 05/10/2019 revealed no findings to suggest metastatic disease.  Chest abdomen and pelvis CT on 05/10/2020 revealed no evidence of metastatic disease to the chest, abdomen or pelvis. There was cylindrical bronchiectasis and scarring in the RIGHT middle and bilateral lower lobes as before. Along the LEFT flank, an ovoid partially calcified fatty density area measured 5.1 x 3.9 cm and was unchanged since the PET exam from 2018 perhaps even slightly smaller as it measured approximately 5.8 x 4.7 cm on the prior exam. This may represent an area of fat necrosis  and was likely smaller than in 2018.  She has rheumatoid arthritis and is on methotrexate.  Enbrel was discontinued in 2015.  She is followed by Dr Jefm Bryant.  12/31/21 unilateral left mammogram showed possible asymmetry in the left breast.    INTERVAL HISTORY Kristina Pittman is a 77 y.o. female who has above history reviewed by me today presents for follow up visit  She reports feeling well. No new complaints.  Denies any  breast concerns.  Her RA symptoms are well controlled with MTX. She follows up with rheumatoogy.    Review of Systems  Constitutional:  Negative for appetite change, chills, fatigue and fever.  HENT:   Negative for hearing loss and voice change.   Eyes:  Negative for eye problems.  Respiratory:  Negative for chest tightness and cough.   Cardiovascular:  Negative for chest pain.  Gastrointestinal:  Negative for abdominal distention, abdominal pain and blood in stool.  Endocrine: Negative for hot flashes.  Genitourinary:  Negative for difficulty urinating and frequency.   Musculoskeletal:  Positive for arthralgias.  Skin:  Negative for itching and rash.  Neurological:  Negative for extremity weakness.  Hematological:  Negative for adenopathy.  Psychiatric/Behavioral:  Negative for confusion.      Past Medical History:  Diagnosis Date   Breast cancer ([place])    right    Collagen vascular disease ([place])    RA   Depression    Hypercholesterolemia    Hyperlipidemia    Hypertension    Iron deficiency anemia    Iron deficiency anemia    Neurofibromatosis ([place])    Osteoporosis    Personal history of chemotherapy    Personal history of radiation therapy    rheumatoid Arthritis    Skin cancer    melanoma    Past Surgical History:  Procedure Laterality Date   ABDOMINAL HYSTERECTOMY     COLONOSCOPY WITH PROPOFOL N/A 04/20/2021   Procedure: COLONOSCOPY WITH PROPOFOL;  Surgeon: Lesly Rubenstein, MD;  Location: [facility] ENDOSCOPY;  Service: Endoscopy;  Laterality: N/A;   ESOPHAGOGASTRODUODENOSCOPY (EGD) WITH PROPOFOL N/A 04/20/2021   Procedure: ESOPHAGOGASTRODUODENOSCOPY (EGD) WITH PROPOFOL;  Surgeon: Lesly Rubenstein, MD;  Location: [facility] ENDOSCOPY;  Service: Endoscopy;  Laterality: N/A;   Excision  right breast mass     MASTECTOMY Right 2015   TONSILLECTOMY      Family History  Problem Relation Age of Onset   Breast cancer Neg Hx     Social History:  reports that she has  never smoked. She has never used smokeless tobacco. She reports that she does not drink alcohol and does not use drugs.   Allergies:  Allergies  Allergen Reactions   Rosuvastatin Nausea And Vomiting and Other (See Comments)   Sulfa Antibiotics     Other reaction(s): Unknown   Current Medications: Current Outpatient Medications  Medication Sig Dispense Refill   amLODipine (NORVASC) 2.5 MG tablet TAKE ONE (1) TABLET BY MOUTH ONCE DAILY     aspirin EC 81 MG tablet Take 81 mg by mouth daily.      Calcium-Vitamin D 600-200 MG-UNIT per tablet Take 1 tablet by mouth 2 (two) times daily.      ezetimibe (ZETIA) 10 MG tablet Take 10 mg by mouth daily.      ferrous sulfate 325 (65 FE) MG EC tablet Take 325 mg by mouth daily with breakfast.      folic acid (FOLVITE) 1 MG tablet Take 1 mg by mouth daily.  losartan (COZAAR) 100 MG tablet Take 100 mg by mouth daily.     methotrexate (RHEUMATREX) 2.5 MG tablet Take 12.5 mg by mouth once a week.      Multiple Vitamin (MULTI-VITAMINS) TABS Take by mouth.     simvastatin (ZOCOR) 20 MG tablet Take 20 mg by mouth daily.      spironolactone (ALDACTONE) 50 MG tablet Take 50 mg by mouth daily.     No current facility-administered medications for this visit.   Facility-Administered Medications Ordered in Other Visits  Medication Dose Route Frequency Provider Last Rate Last Admin   peginterferon alfa-2b (SYLATRON) injection 198 mcg  3 mcg/kg Subcutaneous Once Choksi, Delorise Shiner, MD       peginterferon alfa-2b (SYLATRON) injection 198 mcg  3 mcg/kg Subcutaneous Once Forest Gleason, MD         Performance status (ECOG): 0  Vital Signs Blood pressure 121/75, pulse 79, temperature (!) 97.4 F (36.3 C), temperature source Tympanic, resp. rate 18, weight 161 lb 14.4 oz (73.4 kg), SpO2 99 %.  Physical Exam Constitutional:      General: She is not in acute distress.    Appearance: She is not diaphoretic.  HENT:     Head: Normocephalic and atraumatic.      Nose: Nose normal.     Mouth/Throat:     Pharynx: No oropharyngeal exudate.  Eyes:     General: No scleral icterus.    Pupils: Pupils are equal, round, and reactive to light.  Cardiovascular:     Rate and Rhythm: Normal rate and regular rhythm.     Heart sounds: No murmur heard. Pulmonary:     Effort: Pulmonary effort is normal. No respiratory distress.     Breath sounds: No rales.  Chest:     Chest wall: No tenderness.  Abdominal:     General: There is no distension.     Palpations: Abdomen is soft.     Tenderness: There is no abdominal tenderness.  Musculoskeletal:        General: Normal range of motion.     Cervical back: Normal range of motion and neck supple.  Skin:    General: Skin is warm and dry.     Findings: No erythema.  Neurological:     Mental Status: She is alert and oriented to person, place, and time.     Cranial Nerves: No cranial nerve deficit.     Motor: No abnormal muscle tone.     Coordination: Coordination normal.  Psychiatric:        Mood and Affect: Affect normal.   Breast Examination S/p right mastectomy, no chest wall mass.  Left breast no palpable mass No palpable axillary lymphadenopathy bilaterally.    :Labs    Latest Ref Rng & Units 01/06/2023    8:32 AM 01/04/2022   11:07 AM 01/04/2021   10:53 AM  CBC  WBC 4.0 - 10.5 K/uL 5.9  6.4  6.9   Hemoglobin 12.0 - 15.0 g/dL 11.7  12.2  11.5   Hematocrit 36.0 - 46.0 % 36.7  38.1  35.7   Platelets 150 - 400 K/uL 212  216  245       Latest Ref Rng & Units 01/04/2022   11:07 AM 01/04/2021   10:53 AM 05/10/2020    9:25 AM  CMP  Glucose 70 - 99 mg/dL 114  172  142   BUN 8 - 23 mg/dL '18  15  13   '$ Creatinine 0.44 - 1.00 mg/dL 0.79  0.74  0.87   Sodium 135 - 145 mmol/L 136  134  132   Potassium 3.5 - 5.1 mmol/L 4.3  3.9  3.9   Chloride 98 - 111 mmol/L 102  99  97   CO2 22 - 32 mmol/L '27  24  24   '$ Calcium 8.9 - 10.3 mg/dL 9.7  9.3  9.1   Total Protein 6.5 - 8.1 g/dL 8.0  7.4  8.1   Total Bilirubin 0.3  - 1.2 mg/dL 0.3  0.5  0.7   Alkaline Phos 38 - 126 U/L 52  51  54   AST 15 - 41 U/L '16  15  11   '$ ALT 0 - 44 U/L 11  17  12

## 2023-01-14 ENCOUNTER — Ambulatory Visit
Admission: RE | Admit: 2023-01-14 | Discharge: 2023-01-14 | Disposition: A | Discharge status: Home / Self Care | Payer: Medicare Other | Source: Ambulatory Visit | Attending: Internal Medicine | Admitting: Internal Medicine

## 2023-01-14 DIAGNOSIS — Z1231 Encounter for screening mammogram for malignant neoplasm of breast: Secondary | ICD-10-CM | POA: Insufficient documentation

## 2023-12-09 ENCOUNTER — Other Ambulatory Visit: Payer: Self-pay | Admitting: Internal Medicine

## 2023-12-09 DIAGNOSIS — Z1231 Encounter for screening mammogram for malignant neoplasm of breast: Secondary | ICD-10-CM

## 2024-01-06 ENCOUNTER — Inpatient Hospital Stay (HOSPITAL_BASED_OUTPATIENT_CLINIC_OR_DEPARTMENT_OTHER): Payer: Medicare Other | Admitting: Oncology

## 2024-01-06 ENCOUNTER — Inpatient Hospital Stay: Payer: Medicare Other | Attending: Oncology

## 2024-01-06 ENCOUNTER — Encounter: Payer: Self-pay | Admitting: Oncology

## 2024-01-06 ENCOUNTER — Telehealth: Payer: Self-pay

## 2024-01-06 VITALS — BP 130/65 | HR 83 | Temp 97.6°F | Resp 20 | Wt 175.0 lb

## 2024-01-06 DIAGNOSIS — R7989 Other specified abnormal findings of blood chemistry: Secondary | ICD-10-CM

## 2024-01-06 DIAGNOSIS — C4352 Malignant melanoma of skin of breast: Secondary | ICD-10-CM | POA: Diagnosis not present

## 2024-01-06 DIAGNOSIS — M069 Rheumatoid arthritis, unspecified: Secondary | ICD-10-CM | POA: Insufficient documentation

## 2024-01-06 DIAGNOSIS — Z85820 Personal history of malignant melanoma of skin: Secondary | ICD-10-CM | POA: Insufficient documentation

## 2024-01-06 LAB — CBC WITH DIFFERENTIAL (CANCER CENTER ONLY)
Abs Immature Granulocytes: 0.05 10*3/uL (ref 0.00–0.07)
Basophils Absolute: 0 10*3/uL (ref 0.0–0.1)
Basophils Relative: 1 %
Eosinophils Absolute: 0.1 10*3/uL (ref 0.0–0.5)
Eosinophils Relative: 2 %
HCT: 37.6 % (ref 36.0–46.0)
Hemoglobin: 12 g/dL (ref 12.0–15.0)
Immature Granulocytes: 1 %
Lymphocytes Relative: 16 %
Lymphs Abs: 0.8 10*3/uL (ref 0.7–4.0)
MCH: 25.7 pg — ABNORMAL LOW (ref 26.0–34.0)
MCHC: 31.9 g/dL (ref 30.0–36.0)
MCV: 80.5 fL (ref 80.0–100.0)
Monocytes Absolute: 0.6 10*3/uL (ref 0.1–1.0)
Monocytes Relative: 12 %
Neutro Abs: 3.6 10*3/uL (ref 1.7–7.7)
Neutrophils Relative %: 68 %
Platelet Count: 211 10*3/uL (ref 150–400)
RBC: 4.67 MIL/uL (ref 3.87–5.11)
RDW: 15.9 % — ABNORMAL HIGH (ref 11.5–15.5)
WBC Count: 5.2 10*3/uL (ref 4.0–10.5)
nRBC: 0 % (ref 0.0–0.2)

## 2024-01-06 LAB — IRON AND TIBC
Iron: 48 ug/dL (ref 28–170)
Saturation Ratios: 15 % (ref 10.4–31.8)
TIBC: 322 ug/dL (ref 250–450)
UIBC: 274 ug/dL

## 2024-01-06 LAB — CMP (CANCER CENTER ONLY)
ALT: 14 U/L (ref 0–44)
AST: 12 U/L — ABNORMAL LOW (ref 15–41)
Albumin: 4.1 g/dL (ref 3.5–5.0)
Alkaline Phosphatase: 47 U/L (ref 38–126)
Anion gap: 10 (ref 5–15)
BUN: 12 mg/dL (ref 8–23)
CO2: 25 mmol/L (ref 22–32)
Calcium: 9.3 mg/dL (ref 8.9–10.3)
Chloride: 100 mmol/L (ref 98–111)
Creatinine: 0.67 mg/dL (ref 0.44–1.00)
GFR, Estimated: >60 mL/min (ref >60)
Glucose, Bld: 119 mg/dL — ABNORMAL HIGH (ref 70–99)
Potassium: 4.2 mmol/L (ref 3.5–5.1)
Sodium: 135 mmol/L (ref 135–145)
Total Bilirubin: 0.5 mg/dL (ref 0.0–1.2)
Total Protein: 7.7 g/dL (ref 6.5–8.1)

## 2024-01-06 LAB — FERRITIN: Ferritin: 551 ng/mL — ABNORMAL HIGH (ref 11–307)

## 2024-01-06 NOTE — Assessment & Plan Note (Signed)
#   History of Stage II melanoma of the right breast -2015 S/p excision, lymph node dissection, radiation, and adjuvant interferon.  Recommend annual dermatology examination.  Recommend diagnostic mammogram- scheduled by her PCP

## 2024-01-06 NOTE — Assessment & Plan Note (Signed)
#   Elevated Ferritin,  Normal iron saturation. Likely due to chronic inflammation due to RA Negative  hemochromatosis mutation on PCR  Lab Results  Component Value Date   HGB 12.0 01/06/2024   TIBC 322 01/06/2024   IRONPCTSAT 15 01/06/2024   FERRITIN 551 (H) 01/06/2024     Stop oral iron supplementation.  Chronically elevated ferritin likely due to chronic inflammation.  Iron saturation is 15.

## 2024-01-06 NOTE — Progress Notes (Signed)
 Hematology/Oncology Progress note [phone] Fax:([phone]) [phone]     Chief Complaint: Kristina Pittman is a 78 y.o. 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  Recommend annual dermatology examination.  Recommend diagnostic mammogram- scheduled by her PCP  Elevated ferritin # Elevated Ferritin,  Normal iron saturation. Likely due to chronic inflammation due to RA Negative  hemochromatosis mutation on PCR  Lab Results  Component Value Date   HGB 12.0 01/06/2024   TIBC 322 01/06/2024   IRONPCTSAT 15 01/06/2024   FERRITIN 551 (H) 01/06/2024     Stop oral iron supplementation.  Chronically elevated ferritin likely due to chronic inflammation.  Iron saturation is 15.  No need for phlebotomy.   Follow up PRN.  All questions were answered. The patient knows to call the clinic with any problems, questions or concerns.  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  There was ulceration present.  Invasive melanoma involved the peripheral and dep margins.  Clark level was V.  There was 12 mitosis per mm2.  There were tumor infiltrating lymphocytes.  There was no lymphvascular or perineural invasion.  Pathologic stage was pT4bNxMx.  PET scan on 03/17/2014 revealed no residual malignancy or metastatic  disease.  Head MRI on 03/22/2014 revealed no acute intracranial findings.     03/24/2014 She underwent modified right radical mastectomy and lymph node dissection .  Pathology revealed no residual melanoma.  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  There were 17 lymph nodes negative for malignancy.  BRAF V600E was negative.  Final pathologic stage was pT4bN0.  She received right chest wall and peripheral lymphatic radiation.  She received peg-interferon alfa-2b (completed 06/2015).  She has undergone yearly PET scans (11/09/2014, 04/23/2016, 05/15/2017) and CT scans (05/14/2018, 05/10/2019) with no evidence of recurrent disease.  Chest, abdomen, and pelvis CT on 05/10/2019 revealed no findings to suggest metastatic disease.  Chest abdomen and pelvis CT on 05/10/2020 revealed no evidence of metastatic disease to the chest, abdomen or pelvis. There was cylindrical bronchiectasis and scarring in the RIGHT middle and bilateral lower lobes as before. Along the LEFT flank, an ovoid partially calcified fatty density area measured 5.1 x 3.9 cm and was unchanged since the PET exam from 2018 perhaps even slightly smaller as it measured approximately 5.8 x 4.7 cm on the prior exam. This may represent an area of fat necrosis and was likely smaller than in 2018.  She has rheumatoid arthritis and is on methotrexate.  Enbrel was discontinued in 2015.  She is followed by Dr Gavin Potters.  12/31/21 unilateral left mammogram showed possible asymmetry in the left breast.    INTERVAL HISTORY Kristina Pittman is a 78 y.o. female who has above history reviewed by me today presents for follow up visit  She reports feeling well. No new complaints.  Denies any breast concerns.  Her RA  symptoms are well controlled with MTX. She follows up with rheumatoogy.    Review of Systems  Constitutional:  Negative for appetite change, chills, fatigue and fever.  HENT:   Negative for hearing loss and voice change.   Eyes:  Negative for eye problems.   Respiratory:  Negative for chest tightness and cough.   Cardiovascular:  Negative for chest pain.  Gastrointestinal:  Negative for abdominal distention, abdominal pain and blood in stool.  Endocrine: Negative for hot flashes.  Genitourinary:  Negative for difficulty urinating and frequency.   Musculoskeletal:  Positive for arthralgias.  Skin:  Negative for itching and rash.  Neurological:  Negative for extremity weakness.  Hematological:  Negative for adenopathy.  Psychiatric/Behavioral:  Negative for confusion.      Past Medical History:  Diagnosis Date   Breast cancer (HCC)    right    Collagen vascular disease (HCC)    RA   Depression    Hypercholesterolemia    Hyperlipidemia    Hypertension    Iron deficiency anemia    Iron deficiency anemia    Neurofibromatosis (HCC)    Osteoporosis    Personal history of chemotherapy    Personal history of radiation therapy    rheumatoid Arthritis    Skin cancer    melanoma    Past Surgical History:  Procedure Laterality Date   ABDOMINAL HYSTERECTOMY     COLONOSCOPY WITH PROPOFOL N/A 04/20/2021   Procedure: COLONOSCOPY WITH PROPOFOL;  Surgeon: Regis Bill, MD;  Location: [facility] ENDOSCOPY;  Service: Endoscopy;  Laterality: N/A;   ESOPHAGOGASTRODUODENOSCOPY (EGD) WITH PROPOFOL N/A 04/20/2021   Procedure: ESOPHAGOGASTRODUODENOSCOPY (EGD) WITH PROPOFOL;  Surgeon: Regis Bill, MD;  Location: [facility] ENDOSCOPY;  Service: Endoscopy;  Laterality: N/A;   Excision  right breast mass     MASTECTOMY Right 2015   TONSILLECTOMY      Family History  Problem Relation Age of Onset   Breast cancer Neg Hx     Social History:  reports that she has never smoked. She has never used smokeless tobacco. She reports that she does not drink alcohol and does not use drugs.   Allergies:  Allergies  Allergen Reactions   Rosuvastatin Nausea And Vomiting and Other (See Comments)   Sulfa Antibiotics     Other reaction(s): Unknown   Current  Medications: Current Outpatient Medications  Medication Sig Dispense Refill   amLODipine (NORVASC) 2.5 MG tablet TAKE ONE (1) TABLET BY MOUTH ONCE DAILY     aspirin EC 81 MG tablet Take 81 mg by mouth daily.      Calcium-Vitamin D 600-200 MG-UNIT per tablet Take 1 tablet by mouth 2 (two) times daily.      ezetimibe (ZETIA) 10 MG tablet Take 10 mg by mouth daily.      folic acid (FOLVITE) 1 MG tablet Take 1 mg by mouth daily.      losartan (COZAAR) 100 MG tablet Take 100 mg by mouth daily.     methotrexate (RHEUMATREX) 2.5 MG tablet Take 12.5 mg by mouth once a week.      Multiple Vitamin (MULTI-VITAMINS) TABS Take by mouth.     simvastatin (ZOCOR) 20 MG tablet Take 20 mg by mouth daily.      spironolactone (ALDACTONE) 50 MG tablet Take 50 mg by mouth daily.     No current facility-administered medications for this visit.   Facility-Administered Medications Ordered in Other Visits  Medication Dose Route Frequency Provider Last Rate Last Admin   peginterferon  alfa-2b (SYLATRON) injection 198 mcg  3 mcg/kg Subcutaneous Once Choksi, Valarie Cones, MD       peginterferon alfa-2b (SYLATRON) injection 198 mcg  3 mcg/kg Subcutaneous Once Johney Maine, MD         Performance status (ECOG): 0  Vital Signs Blood pressure 130/65, pulse 83, temperature 97.6 F (36.4 C), resp. rate 20, weight 175 lb (79.4 kg), SpO2 100%.  Physical Exam Constitutional:      General: She is not in acute distress.    Appearance: She is not diaphoretic.  HENT:     Head: Normocephalic and atraumatic.     Nose: Nose normal.     Mouth/Throat:     Pharynx: No oropharyngeal exudate.  Eyes:     General: No scleral icterus.    Pupils: Pupils are equal, round, and reactive to light.  Cardiovascular:     Rate and Rhythm: Normal rate and regular rhythm.     Heart sounds: No murmur heard. Pulmonary:     Effort: Pulmonary effort is normal. No respiratory distress.     Breath sounds: No rales.  Chest:     Chest wall: No  tenderness.  Abdominal:     General: There is no distension.     Palpations: Abdomen is soft.     Tenderness: There is no abdominal tenderness.  Musculoskeletal:        General: Normal range of motion.     Cervical back: Normal range of motion and neck supple.  Skin:    General: Skin is warm and dry.     Findings: No erythema.  Neurological:     Mental Status: She is alert and oriented to person, place, and time.     Cranial Nerves: No cranial nerve deficit.     Motor: No abnormal muscle tone.     Coordination: Coordination normal.  Psychiatric:        Mood and Affect: Affect normal.   Breast Examination S/p right mastectomy, no chest wall mass.  Left breast no palpable mass No palpable axillary lymphadenopathy bilaterally.    :Labs    Latest Ref Rng & Units 01/06/2024    9:45 AM 01/06/2023    8:32 AM 01/04/2022   11:07 AM  CBC  WBC 4.0 - 10.5 K/uL 5.2  5.9  6.4   Hemoglobin 12.0 - 15.0 g/dL 98.1  19.1  47.8   Hematocrit 36.0 - 46.0 % 37.6  36.7  38.1   Platelets 150 - 400 K/uL 211  212  216       Latest Ref Rng & Units 01/06/2024    9:45 AM 01/06/2023    8:33 AM 01/04/2022   11:07 AM  CMP  Glucose 70 - 99 mg/dL 295  621  308   BUN 8 - 23 mg/dL 12  13  18    Creatinine 0.44 - 1.00 mg/dL 6.57  8.46  9.62   Sodium 135 - 145 mmol/L 135  137  136   Potassium 3.5 - 5.1 mmol/L 4.2  3.8  4.3   Chloride 98 - 111 mmol/L 100  102  102   CO2 22 - 32 mmol/L 25  25  27    Calcium 8.9 - 10.3 mg/dL 9.3  9.5  9.7   Total Protein 6.5 - 8.1 g/dL 7.7  7.7  8.0   Total Bilirubin 0.0 - 1.2 mg/dL 0.5  0.6  0.3   Alkaline Phos 38 - 126 U/L 47  46  52   AST 15 -  41 U/L 12  17  16    ALT 0 - 44 U/L 14  15  11

## 2024-01-06 NOTE — Telephone Encounter (Signed)
 Called patient and informed her that her iron level has improved. Ferritin level is still high, but improved from last year and is probably due to chronic inflammation. I informed patient that she does not need blood removal and she can follow-up PRN.

## 2024-01-06 NOTE — Telephone Encounter (Signed)
-----   Message from Rickard Patience sent at 01/06/2024  1:20 PM EST ----- Please let patient know that her iron level has improved. Ferritin level is still high, but improved from last year. I think this is due to chronic inflammation and does not need blood removal.  Follow up PRN  thanks.

## 2024-01-15 ENCOUNTER — Ambulatory Visit
Admission: RE | Admit: 2024-01-15 | Discharge: 2024-01-15 | Disposition: A | Discharge status: Home / Self Care | Payer: Medicare Other | Source: Ambulatory Visit | Attending: Internal Medicine | Admitting: Internal Medicine

## 2024-01-15 DIAGNOSIS — Z1231 Encounter for screening mammogram for malignant neoplasm of breast: Secondary | ICD-10-CM | POA: Insufficient documentation

## 2024-02-05 ENCOUNTER — Ambulatory Visit
Admission: RE | Admit: 2024-02-05 | Discharge: 2024-02-05 | Disposition: A | Discharge status: Home / Self Care | Source: Ambulatory Visit | Attending: Internal Medicine | Admitting: Internal Medicine

## 2024-02-05 ENCOUNTER — Other Ambulatory Visit: Payer: Self-pay | Admitting: Internal Medicine

## 2024-02-05 DIAGNOSIS — S22000A Wedge compression fracture of unspecified thoracic vertebra, initial encounter for closed fracture: Secondary | ICD-10-CM | POA: Insufficient documentation

## 2024-02-05 MED ORDER — GADOBUTROL 1 MMOL/ML IV SOLN
7.0000 mL | Freq: Once | INTRAVENOUS | Status: AC | PRN
  Administered 2024-02-05: 7 mL via INTRAVENOUS

## 2024-02-06 ENCOUNTER — Other Ambulatory Visit: Payer: Self-pay | Admitting: Internal Medicine

## 2024-02-06 DIAGNOSIS — S32012A Unstable burst fracture of first lumbar vertebra, initial encounter for closed fracture: Secondary | ICD-10-CM

## 2024-02-10 ENCOUNTER — Ambulatory Visit
Admission: RE | Admit: 2024-02-10 | Discharge: 2024-02-10 | Disposition: A | Discharge status: Home / Self Care | Source: Ambulatory Visit | Attending: Internal Medicine | Admitting: Internal Medicine

## 2024-02-10 DIAGNOSIS — S32012A Unstable burst fracture of first lumbar vertebra, initial encounter for closed fracture: Secondary | ICD-10-CM

## 2024-02-10 HISTORY — PX: IR RADIOLOGIST EVAL & MGMT: IMG5224

## 2024-02-10 NOTE — Consult Note (Signed)
 Chief Complaint: Patient was seen in consultation today for back pain, L1 fracture at the request of Anderson,Marshall W  Referring Physician(s): Anderson,Marshall W  History of Present Illness: Kristina Pittman is a 78 y.o. female Who was in her usual state of health until March 30th when she was bending over to remove traffic cones from her driveway.  She stood up in experienced significant pain in her back.  Over the rest of the day the pain became more debilitating in till it was a 10/10.  She went to see her primary care doctor and was diagnosed with an L1 compression fracture.  She has been taking tramadol which helps some with the pain but makes her feel confused and not like herself.  Additionally, her pain is quite debilitating.  She scored a 22/24 on the [employer] disability questionnaire.  Prior to this fracture she was able to ambulate on her own but now she requires the use of a cane and occasionally a wheelchair.  Her pain is most severe when transitioning positions from sitting to standing or lying to standing.  She has not been able to sleep in the bed and has to sleep upright in a recliner.    Past Medical History:  Diagnosis Date   Breast cancer (HCC)    right    Collagen vascular disease (HCC)    RA   Depression    Hypercholesterolemia    Hyperlipidemia    Hypertension    Iron deficiency anemia    Iron deficiency anemia    Neurofibromatosis (HCC)    Osteoporosis    Personal history of chemotherapy    Personal history of radiation therapy    rheumatoid Arthritis    Skin cancer    melanoma    Past Surgical History:  Procedure Laterality Date   ABDOMINAL HYSTERECTOMY     COLONOSCOPY WITH PROPOFOL N/A 04/20/2021   Procedure: COLONOSCOPY WITH PROPOFOL;  Surgeon: Regis Bill, MD;  Location: [facility] ENDOSCOPY;  Service: Endoscopy;  Laterality: N/A;   ESOPHAGOGASTRODUODENOSCOPY (EGD) WITH PROPOFOL N/A 04/20/2021   Procedure: ESOPHAGOGASTRODUODENOSCOPY  (EGD) WITH PROPOFOL;  Surgeon: Regis Bill, MD;  Location: [facility] ENDOSCOPY;  Service: Endoscopy;  Laterality: N/A;   Excision  right breast mass     MASTECTOMY Right 2015   TONSILLECTOMY      Allergies: Rosuvastatin and Sulfa antibiotics  Medications: Prior to Admission medications   Medication Sig Start Date End Date Taking? Authorizing Provider  amLODipine (NORVASC) 2.5 MG tablet TAKE ONE (1) TABLET BY MOUTH ONCE DAILY 03/22/19  Yes [provider]  aspirin EC 81 MG tablet Take 81 mg by mouth daily.    Yes [provider]  Calcium-Vitamin D 600-200 MG-UNIT per tablet Take 1 tablet by mouth 2 (two) times daily.    Yes [provider]  ezetimibe (ZETIA) 10 MG tablet Take 10 mg by mouth daily.  12/06/15 05/20/57 Yes [provider]  folic acid (FOLVITE) 1 MG tablet Take 1 mg by mouth daily.  09/19/14  Yes [provider]  losartan (COZAAR) 100 MG tablet Take 100 mg by mouth daily.   Yes [provider]  methotrexate (RHEUMATREX) 2.5 MG tablet Take 12.5 mg by mouth once a week.  01/17/15  Yes [provider]  Multiple Vitamin (MULTI-VITAMINS) TABS Take by mouth.   Yes [provider]  simvastatin (ZOCOR) 20 MG tablet Take 20 mg by mouth daily.  12/06/15 05/21/67 Yes [provider]  tiZANidine (ZANAFLEX)  2 MG tablet Take 2 mg by mouth 3 (three) times daily. 02/03/24  Yes [provider]  traMADol (ULTRAM) 50 MG tablet Take 50 mg by mouth every 6 (six) hours as needed. 02/05/24  Yes [provider]  spironolactone (ALDACTONE) 50 MG tablet Take 50 mg by mouth daily. 06/25/18 01/06/23  [provider]     Family History  Problem Relation Age of Onset   Breast cancer Neg Hx     Social History   Socioeconomic History   Marital status: Single    Spouse name: Not on file   Number of children: Not on file   Years of education: Not on file   Highest education level: Not on file   Occupational History   Not on file  Tobacco Use   Smoking status: Never   Smokeless tobacco: Never  Vaping Use   Vaping status: Not on file  Substance and Sexual Activity   Alcohol use: No   Drug use: Never   Sexual activity: Not on file  Other Topics Concern   Not on file  Social History Narrative   Not on file   Social Drivers of Health   Financial Resource Strain: Not on file  Food Insecurity: Not on file  Transportation Needs: Not on file  Physical Activity: Not on file  Stress: Not on file  Social Connections: Not on file   Review of Systems: A 12 point ROS discussed and pertinent positives are indicated in the HPI above.  All other systems are negative.  Review of Systems  Vital Signs: BP (!) 112/58   Pulse 79   Temp 98.1 F (36.7 C)   Resp 16   SpO2 96%      Physical Exam Constitutional:      Appearance: Normal appearance. She is obese.  HENT:     Head: Normocephalic and atraumatic.  Eyes:     General: No scleral icterus. Cardiovascular:     Rate and Rhythm: Normal rate.  Pulmonary:     Effort: Pulmonary effort is normal.  Abdominal:     General: There is no distension.     Palpations: Abdomen is soft.     Tenderness: There is no abdominal tenderness.  Musculoskeletal:       Back:     Comments: TTP at the L1 spinous process  Skin:    General: Skin is warm and dry.  Neurological:     Mental Status: She is alert and oriented to person, place, and time.  Psychiatric:        Mood and Affect: Mood normal.        Behavior: Behavior normal.       Imaging: MR THORACIC SPINE W WO CONTRAST Result Date: 02/05/2024 CLINICAL DATA:  Compression fracture EXAM: MRI THORACIC AND LUMBAR SPINE WITHOUT AND WITH CONTRAST TECHNIQUE: Multiplanar and multiecho pulse sequences of the thoracic and lumbar spine were obtained without and with intravenous contrast. CONTRAST:  7mL GADAVIST GADOBUTROL 1 MMOL/ML IV SOLN COMPARISON:  None Available. FINDINGS: MRI  THORACIC SPINE FINDINGS Alignment:  Physiologic. Vertebrae: No fracture, evidence of discitis, or bone lesion. Cord:  Normal signal and morphology. Paraspinal and other soft tissues: Enlarged left lobe of the thyroid gland. Bibasilar atelectasis. Disc levels: No thoracic spinal canal or neural foraminal stenosis. MRI LUMBAR SPINE FINDINGS Segmentation:  Standard. Alignment:  Grade 1 anterolisthesis at L3-4 and L4-5 Vertebrae: Incomplete burst fracture of L1 with 50% height loss. Osteonecrotic cleft at the fracture site. Conus medullaris:  Extends to the L2 level and appears normal. Paraspinal and other soft tissues: Negative Disc levels: T12-L1: 5 mm of retropulsion of the posterior superior L1 endplate narrows the ventral thecal sac but does not cause spinal canal or neural foraminal stenosis. L1-L2: Normal disc space and facet joints. No spinal canal stenosis. No neural foraminal stenosis. L2-L3: Normal disc space and facet joints. No spinal canal stenosis. No neural foraminal stenosis. L3-L4: Small disc bulge with moderate facet hypertrophy. No spinal canal stenosis. Moderate bilateral neural foraminal stenosis. L4-L5: Left asymmetric disc bulge and severe facet hypertrophy. Bilateral, left greater than right, lateral recess stenosis without central spinal canal stenosis. Moderate right and severe left neural foraminal stenosis. L5-S1: Small disc bulge with endplate spurring and severe facet arthrosis. Right lateral recess narrowing without central spinal canal stenosis. Mild left neural foraminal stenosis. Visualized sacrum: Normal. IMPRESSION: 1. Incomplete burst fracture of L1 with 50% height loss and 5 mm of retropulsion of the posterior superior L1 endplate. No spinal canal or neural foraminal stenosis. 2. Moderate bilateral L3-4 and severe left L4-5 neural foraminal stenosis. 3. Bilateral, left greater than right, lateral recess stenosis at L4-5 may affect the descending L5 nerve roots. 4. Right lateral  recess narrowing at L5-S1 may affect the descending right S1 nerve roots. Electronically Signed   By: Deatra Robinson M.D.   On: 02/05/2024 22:02   MR Lumbar Spine W Wo Contrast Result Date: 02/05/2024 CLINICAL DATA:  Compression fracture EXAM: MRI THORACIC AND LUMBAR SPINE WITHOUT AND WITH CONTRAST TECHNIQUE: Multiplanar and multiecho pulse sequences of the thoracic and lumbar spine were obtained without and with intravenous contrast. CONTRAST:  7mL GADAVIST GADOBUTROL 1 MMOL/ML IV SOLN COMPARISON:  None Available. FINDINGS: MRI THORACIC SPINE FINDINGS Alignment:  Physiologic. Vertebrae: No fracture, evidence of discitis, or bone lesion. Cord:  Normal signal and morphology. Paraspinal and other soft tissues: Enlarged left lobe of the thyroid gland. Bibasilar atelectasis. Disc levels: No thoracic spinal canal or neural foraminal stenosis. MRI LUMBAR SPINE FINDINGS Segmentation:  Standard. Alignment:  Grade 1 anterolisthesis at L3-4 and L4-5 Vertebrae: Incomplete burst fracture of L1 with 50% height loss. Osteonecrotic cleft at the fracture site. Conus medullaris: Extends to the L2 level and appears normal. Paraspinal and other soft tissues: Negative Disc levels: T12-L1: 5 mm of retropulsion of the posterior superior L1 endplate narrows the ventral thecal sac but does not cause spinal canal or neural foraminal stenosis. L1-L2: Normal disc space and facet joints. No spinal canal stenosis. No neural foraminal stenosis. L2-L3: Normal disc space and facet joints. No spinal canal stenosis. No neural foraminal stenosis. L3-L4: Small disc bulge with moderate facet hypertrophy. No spinal canal stenosis. Moderate bilateral neural foraminal stenosis. L4-L5: Left asymmetric disc bulge and severe facet hypertrophy. Bilateral, left greater than right, lateral recess stenosis without central spinal canal stenosis. Moderate right and severe left neural foraminal stenosis. L5-S1: Small disc bulge with endplate spurring and severe  facet arthrosis. Right lateral recess narrowing without central spinal canal stenosis. Mild left neural foraminal stenosis. Visualized sacrum: Normal. IMPRESSION: 1. Incomplete burst fracture of L1 with 50% height loss and 5 mm of retropulsion of the posterior superior L1 endplate. No spinal canal or neural foraminal stenosis. 2. Moderate bilateral L3-4 and severe left L4-5 neural foraminal stenosis. 3. Bilateral, left greater than right, lateral recess stenosis at L4-5 may affect the descending L5 nerve roots. 4. Right lateral recess narrowing at L5-S1 may affect the descending right S1 nerve roots. Electronically Signed   By:  Deatra Robinson M.D.   On: 02/05/2024 22:02   MM 3D SCREENING MAMMOGRAM UNILATERAL LEFT BREAST Result Date: 01/19/2024 CLINICAL DATA:  Screening. EXAM: DIGITAL SCREENING UNILATERAL LEFT MAMMOGRAM WITH CAD AND TOMOSYNTHESIS TECHNIQUE: Left screening digital craniocaudal and mediolateral oblique mammograms were obtained. Left screening digital breast tomosynthesis was performed. The images were evaluated with computer-aided detection. COMPARISON:  Previous exam(s). ACR Breast Density Category c: The breasts are heterogeneously dense, which may obscure small masses. FINDINGS: There are no findings suspicious for malignancy. IMPRESSION: No mammographic evidence of malignancy. A result letter of this screening mammogram will be mailed directly to the patient. RECOMMENDATION: Screening mammogram in one year. (Code:SM-B-01Y) BI-RADS CATEGORY  1: Negative. Electronically Signed   By: Frederico Hamman M.D.   On: 01/19/2024 12:26    Labs:  CBC: Recent Labs    01/06/24 0945  WBC 5.2  HGB 12.0  HCT 37.6  PLT 211    COAGS: No results for input(s): "INR", "APTT" in the last 8760 hours.  BMP: Recent Labs    01/06/24 0945  NA 135  K 4.2  CL 100  CO2 25  GLUCOSE 119*  BUN 12  CALCIUM 9.3  CREATININE 0.67  GFRNONAA >60    LIVER FUNCTION TESTS: Recent Labs    01/06/24 0945   BILITOT 0.5  AST 12*  ALT 14  ALKPHOS 47  PROT 7.7  ALBUMIN 4.1    TUMOR MARKERS: No results for input(s): "AFPTM", "CEA", "CA199", "CHROMGRNA" in the last 8760 hours.  Assessment & Plan:   Patient has suffered subacute osteoporotic fracture of the L1 vertebra.   History and exam have demonstrated the following:  Acute/Subacute fracture by imaging dated 02/05/24, Pain on exam concordant with level of fracture, Inability to tolerate narcotic pain medication due to side effects, and Significant disability on the [employer] Disability Questionnaire with 22/24 positive symptoms, reflecting significant impact/impairment of (ADLs)   ICD-10-CM Codes that Support Medical Necessity ([email or web address]?[email or web address])  M80.08XA    Age-related osteoporosis with current pathological fracture, vertebra(e), initial encounter for fracture   Plan:  L1 vertebral body augmentation with balloon kyphoplasty  Post-procedure disposition: out-pt Medication holds: ASA  The patient has suffered a fracture of the L1 vertebral body. It is recommended that patients aged 68 years or older be evaluated for possible testing or treatment of osteoporosis. A copy of this consult report is sent to the patient's referring physician.  Advanced Care Plan: The patient did not want to provide an Advanced Care Plan at the time of this visit     Total time spent on today's visit was over  40 Minutes , including both face-to-face time and non face-to-face time, personally spent on review of chart (including labs and relevant imaging), discussing further workup and treatment options, referral to specialist if needed, reviewing outside records if pertinent, answering patient questions, and coordinating care regarding L1 osteoporotic fracture as well as management strategy.   Electronically Signed: Sterling Big 02/10/2024, 4:44 PM

## 2024-02-12 ENCOUNTER — Other Ambulatory Visit: Payer: Self-pay | Admitting: Interventional Radiology

## 2024-02-12 ENCOUNTER — Encounter: Payer: Self-pay | Admitting: Interventional Radiology

## 2024-02-12 DIAGNOSIS — S32012A Unstable burst fracture of first lumbar vertebra, initial encounter for closed fracture: Secondary | ICD-10-CM

## 2024-02-12 DIAGNOSIS — M8008XA Age-related osteoporosis with current pathological fracture, vertebra(e), initial encounter for fracture: Secondary | ICD-10-CM

## 2024-02-18 ENCOUNTER — Telehealth: Payer: Self-pay

## 2024-02-18 NOTE — Discharge Instructions (Signed)

## 2024-02-19 ENCOUNTER — Ambulatory Visit
Admission: RE | Admit: 2024-02-19 | Discharge: 2024-02-19 | Disposition: A | Discharge status: Home / Self Care | Source: Ambulatory Visit | Attending: Interventional Radiology | Admitting: Interventional Radiology

## 2024-02-19 DIAGNOSIS — S32012A Unstable burst fracture of first lumbar vertebra, initial encounter for closed fracture: Secondary | ICD-10-CM

## 2024-02-19 DIAGNOSIS — M8008XA Age-related osteoporosis with current pathological fracture, vertebra(e), initial encounter for fracture: Secondary | ICD-10-CM

## 2024-02-19 HISTORY — PX: IR KYPHO LUMBAR INC FX REDUCE BONE BX UNI/BIL CANNULATION INC/IMAGING: IMG5519

## 2024-02-19 MED ORDER — LIDOCAINE HCL 1 % IJ SOLN
10.0000 mL | Freq: Once | INTRAMUSCULAR | Status: AC
  Administered 2024-02-19: 10 mL via INTRADERMAL

## 2024-02-19 MED ORDER — ACETAMINOPHEN 10 MG/ML IV SOLN: 1000.0000 mg | Freq: Once | INTRAVENOUS | Status: DC

## 2024-02-19 MED ORDER — SODIUM CHLORIDE 0.9 % IV SOLN: INTRAVENOUS | Status: DC

## 2024-02-19 MED ORDER — CEFAZOLIN SODIUM-DEXTROSE 2-4 GM/100ML-% IV SOLN
2.0000 g | INTRAVENOUS | Status: AC
  Administered 2024-02-19: 2 g via INTRAVENOUS

## 2024-02-19 MED ORDER — FENTANYL CITRATE PF 50 MCG/ML IJ SOSY
25.0000 ug | PREFILLED_SYRINGE | INTRAMUSCULAR | Status: DC | PRN
  Administered 2024-02-19 (×2): 50 ug via INTRAVENOUS

## 2024-02-19 MED ORDER — MIDAZOLAM HCL 2 MG/2ML IJ SOLN
1.0000 mg | INTRAMUSCULAR | Status: DC | PRN
  Administered 2024-02-19 (×2): 1 mg via INTRAVENOUS

## 2024-02-23 ENCOUNTER — Telehealth: Payer: Self-pay

## 2024-03-03 ENCOUNTER — Other Ambulatory Visit: Payer: Self-pay | Admitting: Interventional Radiology

## 2024-03-03 DIAGNOSIS — S32010G Wedge compression fracture of first lumbar vertebra, subsequent encounter for fracture with delayed healing: Secondary | ICD-10-CM

## 2024-03-11 ENCOUNTER — Ambulatory Visit
Admission: RE | Admit: 2024-03-11 | Discharge: 2024-03-11 | Disposition: A | Discharge status: Home / Self Care | Source: Ambulatory Visit | Attending: Interventional Radiology | Admitting: Interventional Radiology

## 2024-03-11 DIAGNOSIS — S32010G Wedge compression fracture of first lumbar vertebra, subsequent encounter for fracture with delayed healing: Secondary | ICD-10-CM

## 2024-03-11 HISTORY — PX: IR RADIOLOGIST EVAL & MGMT: IMG5224

## 2024-03-11 NOTE — Progress Notes (Signed)
 Chief Complaint: Patient was seen in consultation today for L1 osteoporotic compression fracture now post cement augmentation at the request of Dayrin Stallone K  Referring Physician(s): Kanya Potteiger K  History of Present Illness: Kristina Pittman is a 78 y.o. 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  She underwent cement augmentation with balloon kyphoplasty on 02/19/2024.  She presents to the clinic today for her follow-up evaluation.  She is in good spirits and reports that her pain has completely resolved.  Her postprocedural course was uncomplicated.  She states that she never had pain following the procedure and did not require any pain medications, ice or other treatments.  She is very pleased with being pain-free again.  Past Medical History:  Diagnosis Date   Breast cancer (HCC)    right    Collagen vascular disease (HCC)    RA   Depression    Hypercholesterolemia    Hyperlipidemia    Hypertension    Iron deficiency anemia    Iron deficiency anemia    Neurofibromatosis (HCC)    Osteoporosis    Personal history of chemotherapy    Personal history of radiation therapy    rheumatoid Arthritis    Skin cancer    melanoma    Past Surgical History:  Procedure Laterality Date   ABDOMINAL HYSTERECTOMY     COLONOSCOPY WITH PROPOFOL  N/A 04/20/2021   Procedure: COLONOSCOPY WITH PROPOFOL ;  Surgeon: Shane Darling, MD;  Location: [facility] END[facility]COPY;  Service: Endoscopy;  Laterality: N/A;   ESOPHAGOGASTRODUODENOSCOPY (EGD) WITH PROPOFOL  N/A 04/20/2021   Procedure: ESOPHAGOGASTRODUODENOSCOPY (EGD) WITH PROPOFOL ;  Surgeon: Shane Darling, MD;  Location: [facility] ENDOSCOPY;  Service: Endoscopy;  Laterality: N/A;   Excision  right breast mass     IR KYPHO LUMBAR INC FX REDUCE BONE BX UNI/BIL CANNULATION INC/IMAGING  02/19/2024   IR RADIOLOGIST EVAL & MGMT  02/10/2024   MASTECTOMY Right 2015   TONSILLECTOMY       Allergies: Rosuvastatin and Sulfa antibiotics  Medications: Prior to Admission medications   Medication Sig Start Date End Date Taking? Authorizing Provider  amLODipine (NORVASC) 2.5 MG tablet TAKE ONE (1) TABLET BY MOUTH ONCE DAILY 03/22/19   [provider]  aspirin EC 81 MG tablet Take 81 mg by mouth daily.     [provider]  Calcium-Vitamin D 600-200 MG-UNIT per tablet Take 1 tablet by mouth 2 (two) times daily.     [provider]  ezetimibe (ZETIA) 10 MG tablet Take 10 mg by mouth daily.  12/06/15 05/20/57  [provider]  folic acid (FOLVITE) 1 MG tablet Take 1 mg by mouth daily.  09/19/14   [provider]  losartan (COZAAR) 100 MG tablet Take 100 mg by mouth daily.    [provider]  methotrexate (RHEUMATREX) 2.5 MG tablet Take 12.5 mg by mouth once a week.  01/17/15   [provider]  Multiple Vitamin (MULTI-VITAMINS) TABS Take by mouth.    [provider]  simvastatin (ZOCOR) 20 MG tablet Take 20 mg by mouth daily.  12/06/15 05/21/67  [provider]  spironolactone (ALDACTONE) 50 MG tablet Take 50 mg by mouth daily. 06/25/18 01/06/23  [provider]  tiZANidine (ZANAFLEX) 2 MG tablet Take 2 mg by mouth 3 (three) times daily. 02/03/24   [provider]  traMADol (ULTRAM) 50 MG tablet Take 50 mg by mouth every 6 (six) hours as needed. 02/05/24   [provider]     Family History  Problem Relation Age of Onset   Breast cancer Neg Hx     Social History   Socioeconomic History   Marital status: Single    Spouse name: Not on file   Number of children: Not on file   Years of education: Not on file   Highest education level: Not on file  Occupational History   Not on file  Tobacco Use   Smoking status: Never   Smokeless tobacco: Never  Vaping Use   Vaping status: Not on file  Substance and Sexual Activity   Alcohol use: No   Drug use: Never   Sexual activity: Not on  file  Other Topics Concern   Not on file  Social History Narrative   Not on file   Social Drivers of Health   Financial Resource Strain: Not on file  Food Insecurity: Not on file  Transportation Needs: Not on file  Physical Activity: Not on file  Stress: Not on file  Social Connections: Not on file    Review of Systems: A 12 point ROS discussed and pertinent positives are indicated in the HPI above.  All other systems are negative.  Review of Systems  Vital Signs: BP 124/79 (BP Location: Left Arm, Patient Position: Sitting, Cuff Size: Normal)   Pulse 78   Temp 98.7 F (37.1 C) (Oral)   Resp 16   SpO2 98%   Advance Care Plan: The advanced care plan/surrogate decision maker was discussed at the time of visit and the patient did not wish to discuss or was not able to name a surrogate decision maker or provide an advance care plan.    Physical Exam Constitutional:      General: She is not in acute distress.    Appearance: Normal appearance.  HENT:     Head: Normocephalic and atraumatic.  Eyes:     General: No scleral icterus. Cardiovascular:     Rate and Rhythm: Normal rate.  Pulmonary:     Effort: Pulmonary effort is normal.  Abdominal:     General: There is no distension.     Tenderness: There is no abdominal tenderness.  Musculoskeletal:        General: No swelling or tenderness.  Skin:    General: Skin is warm and dry.     Comments: Access sites well healed  Neurological:     Mental Status: She is alert and oriented to person, place, and time.  Psychiatric:        Behavior: Behavior normal.      Imaging: IR KYPHO LUMBAR INC FX REDUCE BONE BX UNI/BIL CANNULATION INC/IMAGING Result Date: 02/19/2024 CLINICAL DATA:  78 year old female with osteoporotic compression fracture of the L1 vertebral body with delayed healing. She presents for cement augmentation with balloon kyphoplasty. M80.08XA Age-related osteoporosis with current pathological fracture,  vertebra(e), initial encounter for fracture EXAM: FLUOROSCOPIC GUIDED KYPHOPLASTY OF THE L1 VERTEBRAL BODY COMPARISON:  None Available. MEDICATIONS: As antibiotic prophylaxis, 2 g Ancef  was ordered pre-procedure and administered intravenously by the Radiology nurse within 1 hour of incision. After the procedure, 1 g Tylenol  was administered intravenously by the Radiology nurse. ANESTHESIA/SEDATION: Moderate (conscious) sedation was employed during this procedure. A total of Versed  2 mg and Fentanyl  100 mcg was administered intravenously by the radiology nurse. Moderate Sedation Time: 22 minutes. The patient's level of consciousness and vital signs were monitored continuously by radiology nursing throughout the procedure under my direct supervision. FLUOROSCOPY TIME:  Radiation  exposure index: 28.9 mGy reference air kerma COMPLICATIONS: None immediate. PROCEDURE: The procedure, risks (including but not limited to bleeding, infection, organ damage), benefits, and alternatives were explained to the patient. Questions regarding the procedure were encouraged and answered. The patient understands and consents to the procedure. The patient has suffered a fracture of the L1. It is recommended that patients aged 64 years or older be evaluated for possible testing or treatment of osteoporosis. The patient was placed prone on the fluoroscopic table. The skin overlying the lumbar region was then prepped and draped in the usual sterile fashion. Maximal barrier sterile technique was utilized including caps, mask, sterile gowns, sterile gloves, sterile drape, hand hygiene and skin antiseptic. Intravenous Fentanyl  and Versed  were administered as conscious sedation during continuous cardiorespiratory monitoring by the radiology RN. The left pedicle at L1 was then infiltrated with 1% lidocaine  followed by the advancement of a Kyphon trocar needle through the left pedicle into the posterior one-third of the vertebral body.  Subsequently, the osteo drill was advanced to the anterior third of the vertebral body. The osteo drill was retracted. Through the working cannula, a Kyphon inflatable bone tamp 15 x 2.5 was advanced and positioned with the distal marker approximately 5 mm from the anterior aspect of the cortex. Appropriate positioning was confirmed on the AP projection. At this time, the balloon was expanded using contrast via a Kyphon inflation syringe device via micro tubing. In similar fashion, the right L1 pedicle was infiltrated with 1% lidocaine  followed by the advancement of a second Kyphon trocar needle through the right pedicle into the posterior third of the vertebral body. Subsequently, the osteo drill was coaxially advanced to the anterior right third. The osteo drill was exchanged for a Kyphon inflatable bone tamp 15 x 2.5, advanced to the 5 mm of the anterior aspect of the cortex. The balloon was then expanded using contrast as above. Inflations were continued until there was near apposition with the superior end plate. At this time, methylmethacrylate mixture was reconstituted in the Kyphon bone mixing device system. This was then loaded into the delivery mechanism, attached to the cement delivery system. The balloons were deflated and removed followed by the instillation of methylmethacrylate mixture with excellent filling in the AP and lateral projections. No extravasation was noted in the disk spaces or posteriorly into the spinal canal. No epidural venous contamination was seen. The working cannulae and the bone filler were then retrieved and removed. Hemostasis was achieved with manual compression. The patient tolerated the procedure well without immediate postprocedural complication. IMPRESSION: 1. Technically successful L1 vertebral body augmentation using balloon kyphoplasty. 2. Per CMS PQRS reporting requirements (PQRS Measure 24): Given the patient's age of greater than 50 and the fracture site (hip, distal  radius, or spine), the patient should be tested for osteoporosis using DXA, and the appropriate treatment considered based on the DXA results. Electronically Signed   By: Fernando Hoyer M.D.   On: 02/19/2024 09:50   IR Radiologist Eval & Mgmt Result Date: 02/10/2024 EXAM: NEW PATIENT OFFICE VISIT CHIEF COMPLAINT: SEE EPIC NOTE HISTORY OF PRESENT ILLNESS: SEE EPIC NOTE REVIEW OF SYSTEMS: SEE EPIC NOTE PHYSICAL EXAMINATION: SEE EPIC NOTE ASSESSMENT AND PLAN: SEE EPIC NOTE Electronically Signed   By: Fernando Hoyer M.D.   On: 02/10/2024 16:52    Labs:  CBC: Recent Labs    01/06/24 0945  WBC 5.2  HGB 12.0  HCT 37.6  PLT 211    COAGS: No results for input(s): "INR", "  APTT" in the last 8760 hours.  BMP: Recent Labs    01/06/24 0945  NA 135  K 4.2  CL 100  CO2 25  GLUCOSE 119*  BUN 12  CALCIUM 9.3  CREATININE 0.67  GFRNONAA >60    LIVER FUNCTION TESTS: Recent Labs    01/06/24 0945  BILITOT 0.5  AST 12*  ALT 14  ALKPHOS 47  PROT 7.7  ALBUMIN 4.1    TUMOR MARKERS: No results for input(s): "AFPTM", "CEA", "CA199", "CHROMGRNA" in the last 8760 hours.  Assessment and Plan:  Very pleasant 78 year old female now 3 weeks post L1 cement augmentation with balloon kyphoplasty.  She is doing exceptionally well and pleased to report her pain is completely resolved.  - No further scheduled follow-up at this time     Electronically Signed: Roxie Cord 03/11/2024, 2:24 PM   I spent a total of  15 Minutes in face to face in clinical consultation, greater than 50% of which was counseling/coordinating care for L1 compression fracture
